# Patient Record
Sex: Female | Born: 1995 | Hispanic: No | Marital: Single | Smoking: Never smoker
Health system: Southern US, Community
[De-identification: ages and names within clinical notes are randomized; demographics above are authoritative.]

## PROBLEM LIST (undated history)

## (undated) ENCOUNTER — Inpatient Hospital Stay (HOSPITAL_COMMUNITY): Payer: Self-pay

## (undated) DIAGNOSIS — Z3400 Encounter for supervision of normal first pregnancy, unspecified trimester: Secondary | ICD-10-CM

## (undated) DIAGNOSIS — Z789 Other specified health status: Secondary | ICD-10-CM

## (undated) DIAGNOSIS — R87629 Unspecified abnormal cytological findings in specimens from vagina: Secondary | ICD-10-CM

## (undated) DIAGNOSIS — R8271 Bacteriuria: Secondary | ICD-10-CM

## (undated) HISTORY — DX: Unspecified abnormal cytological findings in specimens from vagina: R87.629

## (undated) HISTORY — PX: TONSILLECTOMY: SUR1361

---
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
## 2016-01-19 ENCOUNTER — Inpatient Hospital Stay (HOSPITAL_COMMUNITY): Payer: Medicaid Other

## 2016-01-19 ENCOUNTER — Inpatient Hospital Stay (HOSPITAL_COMMUNITY)
Admission: AD | Admit: 2016-01-19 | Discharge: 2016-01-19 | Disposition: A | Discharge status: Home / Self Care | Payer: Medicaid Other | Source: Ambulatory Visit | Attending: Family Medicine | Admitting: Family Medicine

## 2016-01-19 ENCOUNTER — Encounter (HOSPITAL_COMMUNITY): Payer: Self-pay | Admitting: *Deleted

## 2016-01-19 DIAGNOSIS — Z3A01 Less than 8 weeks gestation of pregnancy: Secondary | ICD-10-CM | POA: Diagnosis not present

## 2016-01-19 DIAGNOSIS — R1030 Lower abdominal pain, unspecified: Secondary | ICD-10-CM | POA: Insufficient documentation

## 2016-01-19 DIAGNOSIS — N760 Acute vaginitis: Secondary | ICD-10-CM

## 2016-01-19 DIAGNOSIS — R109 Unspecified abdominal pain: Secondary | ICD-10-CM

## 2016-01-19 DIAGNOSIS — O26899 Other specified pregnancy related conditions, unspecified trimester: Secondary | ICD-10-CM

## 2016-01-19 DIAGNOSIS — O26892 Other specified pregnancy related conditions, second trimester: Secondary | ICD-10-CM | POA: Diagnosis not present

## 2016-01-19 DIAGNOSIS — O2341 Unspecified infection of urinary tract in pregnancy, first trimester: Secondary | ICD-10-CM

## 2016-01-19 DIAGNOSIS — B9689 Other specified bacterial agents as the cause of diseases classified elsewhere: Secondary | ICD-10-CM

## 2016-01-19 HISTORY — DX: Other specified health status: Z78.9

## 2016-01-19 LAB — WET PREP, GENITAL
Sperm: NONE SEEN
TRICH WET PREP: NONE SEEN
Yeast Wet Prep HPF POC: NONE SEEN

## 2016-01-19 LAB — URINALYSIS, ROUTINE W REFLEX MICROSCOPIC
Bilirubin Urine: NEGATIVE
Glucose, UA: NEGATIVE mg/dL
Hgb urine dipstick: NEGATIVE
KETONES UR: NEGATIVE mg/dL
NITRITE: POSITIVE — AB
PH: 6 (ref 5.0–8.0)
Protein, ur: NEGATIVE mg/dL
Specific Gravity, Urine: 1.02 (ref 1.005–1.030)

## 2016-01-19 LAB — CBC
HEMATOCRIT: 34.5 % — AB (ref 36.0–46.0)
Hemoglobin: 12.2 g/dL (ref 12.0–15.0)
MCH: 31 pg (ref 26.0–34.0)
MCHC: 35.4 g/dL (ref 30.0–36.0)
MCV: 87.6 fL (ref 78.0–100.0)
PLATELETS: 365 10*3/uL (ref 150–400)
RBC: 3.94 MIL/uL (ref 3.87–5.11)
RDW: 12 % (ref 11.5–15.5)
WBC: 10.6 10*3/uL — AB (ref 4.0–10.5)

## 2016-01-19 LAB — URINE MICROSCOPIC-ADD ON: RBC / HPF: NONE SEEN RBC/hpf (ref 0–5)

## 2016-01-19 LAB — POCT PREGNANCY, URINE: Preg Test, Ur: POSITIVE — AB

## 2016-01-19 LAB — HCG, QUANTITATIVE, PREGNANCY: HCG, BETA CHAIN, QUANT, S: 70113 m[IU]/mL — AB (ref <5)

## 2016-01-19 MED ORDER — NITROFURANTOIN MONOHYD MACRO 100 MG PO CAPS: 100.0000 mg | ORAL_CAPSULE | Freq: Two times a day (BID) | ORAL | Status: DC

## 2016-01-19 MED ORDER — PROMETHAZINE HCL 25 MG PO TABS: 25.0000 mg | ORAL_TABLET | Freq: Four times a day (QID) | ORAL | Status: DC | PRN

## 2016-01-19 MED ORDER — METRONIDAZOLE 500 MG PO TABS: 500.0000 mg | ORAL_TABLET | Freq: Two times a day (BID) | ORAL | Status: DC

## 2016-01-19 MED ORDER — NITROFURANTOIN MONOHYD MACRO 100 MG PO CAPS
100.0000 mg | ORAL_CAPSULE | Freq: Once | ORAL | Status: AC
  Administered 2016-01-19: 100 mg via ORAL
  Filled 2016-01-19: qty 1

## 2016-01-19 MED ORDER — MECLIZINE HCL 25 MG PO TABS: 25.0000 mg | ORAL_TABLET | Freq: Three times a day (TID) | ORAL | Status: DC | PRN

## 2016-01-19 NOTE — MAU Note (Signed)
Pt reports she has ben having lower abd pain x 4 days. Urine is dark with an odor. Took HPT and it was positive. LMP 11/12/15

## 2016-01-19 NOTE — Discharge Instructions (Signed)

## 2016-01-19 NOTE — MAU Provider Note (Signed)
History     CSN: [id number]  Arrival date and time: 01/19/16 1203   First Provider Initiated Contact with Patient 01/19/16 1303      Chief Complaint  Patient presents with  . Abdominal Pain   HPIpt is 19 y.o.G1P0 at 8894w5d by LMP 11/12/2015 who presents with lower abdominal pain for 4 days. Pt +HPT Last week.  Pt was using condoms for contraception. Pt describes abd pain as mod pain cramping on and off. Pt has had some nausea, no vomiting. Pt has had diarrhea on and off since last week- pt normally gets diarrhea with periods. Pt denies dysuria, but has noticed dark urine odor. Pt denies chills or fever and back aches. RN note:      Expand All Collapse All   Pt reports she has ben having lower abd pain x 4 days. Urine is dark with an odor. Took HPT and it was positive. LMP 11/12/15         Past Medical History  Diagnosis Date  . Medical history non-contributory     Past Surgical History  Procedure Laterality Date  . No past surgeries      History reviewed. No pertinent family history.  Social History  Substance Use Topics  . Smoking status: Never Smoker   . Smokeless tobacco: None  . Alcohol Use: No    Allergies:  Allergies  Allergen Reactions  . Kiwi Extract Swelling  . Mushroom Extract Complex Swelling  . Pollen Extract Itching    No prescriptions prior to admission    Review of Systems  Constitutional: Negative for fever and chills.  Gastrointestinal: Positive for nausea, abdominal pain and diarrhea. Negative for vomiting and constipation.  Genitourinary: Positive for frequency. Negative for dysuria.  Musculoskeletal: Negative for back pain.  Neurological: Negative for dizziness and headaches.   Physical Exam   Blood pressure 118/64, pulse 92, temperature 98.3 F (36.8 C), temperature source Oral, resp. rate 18, height 5\' 1"  (1.549 m), weight 110 lb 1.9 oz (49.95 kg), last menstrual period 11/12/2015.  Physical Exam  Nursing note and vitals  reviewed. Constitutional: She is oriented to person, place, and time. She appears well-developed and well-nourished. No distress.  HENT:  Head: Normocephalic.  Eyes: Pupils are equal, round, and reactive to light.  Neck: Normal range of motion. Neck supple.  Cardiovascular: Normal rate.   Respiratory: Effort normal.  GI: Soft. She exhibits no distension. There is no tenderness. There is no rebound and no guarding.  Genitourinary:  Small inclusion cyst right labia majora- no redness or tenderness Small amount of white milky discharge in vault; cervix  Closed, nontender; uterus retroverted- unable to palpate Size- No tenderness with exam  Musculoskeletal: Normal range of motion.  Neurological: She is alert and oriented to person, place, and time.  Skin: Skin is warm and dry.  Psychiatric: She has a normal mood and affect.    MAU Course  Procedures Results for orders placed or performed during the hospital encounter of 01/19/16 (from the past 24 hour(s))  Urinalysis, Routine w reflex microscopic ([facility])     Status: Abnormal   Collection Time: 01/19/16 12:20 PM  Result Value Ref Range   Color, Urine YELLOW YELLOW   APPearance HAZY (A) CLEAR   Specific Gravity, Urine 1.020 1.005 - 1.030   pH 6.0 5.0 - 8.0   Glucose, UA NEGATIVE NEGATIVE mg/dL   Hgb urine dipstick NEGATIVE NEGATIVE   Bilirubin Urine NEGATIVE NEGATIVE   Ketones, ur NEGATIVE NEGATIVE mg/dL  Protein, ur NEGATIVE NEGATIVE mg/dL   Nitrite POSITIVE (A) NEGATIVE   Leukocytes, UA SMALL (A) NEGATIVE  Urine microscopic-add on     Status: Abnormal   Collection Time: 01/19/16 12:20 PM  Result Value Ref Range   Squamous Epithelial / LPF 0-5 (A) NONE SEEN   WBC, UA 6-30 0 - 5 WBC/hpf   RBC / HPF NONE SEEN 0 - 5 RBC/hpf   Bacteria, UA FEW (A) NONE SEEN   Urine-Other MUCOUS PRESENT   Pregnancy, urine POC     Status: Abnormal   Collection Time: 01/19/16 12:33 PM  Result Value Ref Range   Preg Test, Ur POSITIVE (A)  NEGATIVE  hCG, quantitative, pregnancy     Status: Abnormal   Collection Time: 01/19/16 12:59 PM  Result Value Ref Range   hCG, Beta Chain, Sharene Butters, S [phone] (H) <5 mIU/mL  CBC     Status: Abnormal   Collection Time: 01/19/16 12:59 PM  Result Value Ref Range   WBC 10.6 (H) 4.0 - 10.5 K/uL   RBC 3.94 3.87 - 5.11 MIL/uL   Hemoglobin 12.2 12.0 - 15.0 g/dL   HCT 60.4 (L) 54.0 - 98.1 %   MCV 87.6 78.0 - 100.0 fL   MCH 31.0 26.0 - 34.0 pg   MCHC 35.4 30.0 - 36.0 g/dL   RDW 19.1 47.8 - 29.5 %   Platelets 365 150 - 400 K/uL  ABO/Rh     Status: None (Preliminary result)   Collection Time: 01/19/16 12:59 PM  Result Value Ref Range   ABO/RH(D) O POS   Wet prep, genital     Status: Abnormal   Collection Time: 01/19/16  1:23 PM  Result Value Ref Range   Yeast Wet Prep HPF POC NONE SEEN NONE SEEN   Trich, Wet Prep NONE SEEN NONE SEEN   Clue Cells Wet Prep HPF POC PRESENT (A) NONE SEEN   WBC, Wet Prep HPF POC FEW (A) NONE SEEN   Sperm NONE SEEN   GC/Chlamydia pending Urine culture pending macrobid x1 in MAU US Ob Comp Less 14 Wks  01/19/2016  CLINICAL DATA:  Pregnant, for viability EXAM: OBSTETRIC <14 WK [place] AND TRANSVAGINAL OB US TECHNIQUE: Both transabdominal and transvaginal ultrasound examinations were performed for complete evaluation of the gestation as well as the maternal uterus, adnexal regions, and pelvic cul-de-sac. Transvaginal technique was performed to assess early pregnancy. COMPARISON:  None. FINDINGS: Intrauterine gestational sac: Single Yolk sac:  Present Embryo:  Present Cardiac Activity: Present Heart Rate: 154  bpm CRL:  10.3  mm   7 w   1 d                  [place] EDC: 09/05/2016 Subchorionic hemorrhage:  None visualized. Maternal uterus/adnexae: Bilateral ovaries are within normal limits, noting a left corpus luteal cyst. Trace pelvic fluid. IMPRESSION: Single live intrauterine gestation, with estimated gestational age [redacted] weeks 1 day by crown-rump length. Electronically Signed   By:  Charline Bills M.D.   On: 01/19/2016 14:35   US Ob Transvaginal  01/19/2016  CLINICAL DATA:  Pregnant, for viability EXAM: OBSTETRIC <14 WK [place] AND TRANSVAGINAL OB US TECHNIQUE: Both transabdominal and transvaginal ultrasound examinations were performed for complete evaluation of the gestation as well as the maternal uterus, adnexal regions, and pelvic cul-de-sac. Transvaginal technique was performed to assess early pregnancy. COMPARISON:  None. FINDINGS: Intrauterine gestational sac: Single Yolk sac:  Present Embryo:  Present Cardiac Activity: Present Heart Rate: 154  bpm CRL:  10.3  mm   7 w   1 d                  [place] EDC: 09/05/2016 Subchorionic hemorrhage:  None visualized. Maternal uterus/adnexae: Bilateral ovaries are within normal limits, noting a left corpus luteal cyst. Trace pelvic fluid. IMPRESSION: Single live intrauterine gestation, with estimated gestational age [redacted] weeks 1 day by crown-rump length. Electronically Signed   By: Charline Bills M.D.   On: 01/19/2016 14:35   Assessment and Plan  Viable IUP [redacted]w[redacted]d with [facility] 09/05/2016 abd pain in preg UTI MAcrobid BID for 7 days - 1st dose in MAU #13 BV- Flagyl 500mg  BID for 7 days F/u with OB care- [facility] or [facility] for worsening pain, any bleeding, fever or chills Rx for antivert and Phenergan prn nausea  Coryn Mosso 01/19/2016, 1:14 PM

## 2016-01-20 LAB — GC/CHLAMYDIA PROBE AMP (~~LOC~~) NOT AT ARMC
CHLAMYDIA, DNA PROBE: POSITIVE — AB
NEISSERIA GONORRHEA: NEGATIVE

## 2016-01-20 LAB — HIV ANTIBODY (ROUTINE TESTING W REFLEX): HIV Screen 4th Generation wRfx: NONREACTIVE

## 2016-01-20 LAB — ABO/RH: ABO/RH(D): O POS

## 2016-01-21 LAB — CULTURE, OB URINE: Special Requests: NORMAL

## 2016-01-21 NOTE — MAU Provider Note (Signed)
Phone pt and discussed findings of of pos chla. Pt Rx called to [facility] per me and pts partner to get tx.

## 2016-02-14 ENCOUNTER — Ambulatory Visit (INDEPENDENT_AMBULATORY_CARE_PROVIDER_SITE_OTHER): Payer: Medicaid Other | Admitting: Obstetrics and Gynecology

## 2016-02-14 VITALS — BP 105/64 | HR 84 | Wt 115.0 lb

## 2016-02-14 DIAGNOSIS — A749 Chlamydial infection, unspecified: Secondary | ICD-10-CM

## 2016-02-14 DIAGNOSIS — Z3402 Encounter for supervision of normal first pregnancy, second trimester: Secondary | ICD-10-CM

## 2016-02-14 DIAGNOSIS — Z3401 Encounter for supervision of normal first pregnancy, first trimester: Secondary | ICD-10-CM

## 2016-02-14 DIAGNOSIS — O98811 Other maternal infectious and parasitic diseases complicating pregnancy, first trimester: Secondary | ICD-10-CM

## 2016-02-14 DIAGNOSIS — Z3400 Encounter for supervision of normal first pregnancy, unspecified trimester: Secondary | ICD-10-CM | POA: Insufficient documentation

## 2016-02-14 DIAGNOSIS — O98819 Other maternal infectious and parasitic diseases complicating pregnancy, unspecified trimester: Secondary | ICD-10-CM

## 2016-02-14 HISTORY — DX: Encounter for supervision of normal first pregnancy, unspecified trimester: Z34.00

## 2016-02-14 MED ORDER — PRENATAL VITAMINS 0.8 MG PO TABS: 1.0000 | ORAL_TABLET | Freq: Every day | ORAL | 12 refills | Status: DC

## 2016-02-14 NOTE — Progress Notes (Signed)
  Subjective:    Tanya Rodgers is a G1P0 10232w6d being seen today for her first obstetrical visit.  Her obstetrical history is significant for teenage pregnancy, chlamydia infection in first trimester. Patient does intend to breast feed. Pregnancy history fully reviewed.  Patient reports no complaints.  Vitals:   02/14/16 1037  BP: 105/64  Pulse: 84  Weight: 115 lb (52.2 kg)    HISTORY: OB History  Gravida Para Term Preterm AB Living  1            SAB TAB Ectopic Multiple Live Births               # Outcome Date GA Lbr Len/2nd Weight Sex Delivery Anes PTL Lv  1 Current              Past Medical History:  Diagnosis Date  . Medical history non-contributory    Past Surgical History:  Procedure Laterality Date  . NO PAST SURGERIES     No family history on file.   Exam    Uterus:     Pelvic Exam:    Perineum: Normal Perineum   Vulva: normal   Vagina:  normal mucosa, normal discharge   pH:    Cervix: nulliparous appearance and cervix is closed and long   Adnexa: no mass, fullness, tenderness   Bony Pelvis: gynecoid  System: Breast:  normal appearance, no masses or tenderness   Skin: normal coloration and turgor, no rashes    Neurologic: oriented, no focal deficits   Extremities: normal strength, tone, and muscle mass   HEENT extra ocular movement intact   Mouth/Teeth mucous membranes moist, pharynx normal without lesions and dental hygiene good   Neck supple and no masses   Cardiovascular: regular rate and rhythm   Respiratory:  chest clear, no wheezing, crepitations, rhonchi, normal symmetric air entry   Abdomen: soft, non-tender; bowel sounds normal; no masses,  no organomegaly   Urinary:       Assessment:    Pregnancy: G1P0 Patient Active Problem List   Diagnosis Date Noted  . Supervision of normal first pregnancy, antepartum 02/14/2016  . Chlamydia infection affecting pregnancy, antepartum 02/14/2016        Plan:     Initial labs  drawn. Prenatal vitamins. Problem list reviewed and updated. Genetic Screening discussed First Screen: ordered.  Ultrasound discussed; fetal survey: requested. Will be ordered at a later visit  Follow up in 4 weeks. 50% of 30 min visit spent on counseling and coordination of care.     Jalaine Riggenbach 02/14/2016

## 2016-02-14 NOTE — Patient Instructions (Addendum)
First Trimester of Pregnancy The first trimester of pregnancy is from week 1 until the end of week 12 (months 1 through 3). A week after a sperm fertilizes an egg, the egg will implant on the wall of the uterus. This embryo will begin to develop into a baby. Genes from you and your partner are forming the baby. The female genes determine whether the baby is a boy or a girl. At 6-8 weeks, the eyes and face are formed, and the heartbeat can be seen on ultrasound. At the end of 12 weeks, all the baby's organs are formed.  Now that you are pregnant, you will want to do everything you can to have a healthy baby. Two of the most important things are to get good prenatal care and to follow your health care provider's instructions. Prenatal care is all the medical care you receive before the baby's birth. This care will help prevent, find, and treat any problems during the pregnancy and childbirth. BODY CHANGES Your body goes through many changes during pregnancy. The changes vary from woman to woman.   You may gain or lose a couple of pounds at first.  You may feel sick to your stomach (nauseous) and throw up (vomit). If the vomiting is uncontrollable, call your health care provider.  You may tire easily.  You may develop headaches that can be relieved by medicines approved by your health care provider.  You may urinate more often. Painful urination may mean you have a bladder infection.  You may develop heartburn as a result of your pregnancy.  You may develop constipation because certain hormones are causing the muscles that push waste through your intestines to slow down.  You may develop hemorrhoids or swollen, bulging veins (varicose veins).  Your breasts may begin to grow larger and become tender. Your nipples may stick out more, and the tissue that surrounds them (areola) may become darker.  Your gums may bleed and may be sensitive to brushing and flossing.  Dark spots or blotches  (chloasma, mask of pregnancy) may develop on your face. This will likely fade after the baby is born.  Your menstrual periods will stop.  You may have a loss of appetite.  You may develop cravings for certain kinds of food.  You may have changes in your emotions from day to day, such as being excited to be pregnant or being concerned that something may go wrong with the pregnancy and baby.  You may have more vivid and strange dreams.  You may have changes in your hair. These can include thickening of your hair, rapid growth, and changes in texture. Some women also have hair loss during or after pregnancy, or hair that feels dry or thin. Your hair will most likely return to normal after your baby is born. WHAT TO EXPECT AT YOUR PRENATAL VISITS During a routine prenatal visit:  You will be weighed to make sure you and the baby are growing normally.  Your blood pressure will be taken.  Your abdomen will be measured to track your baby's growth.  The fetal heartbeat will be listened to starting around week 10 or 12 of your pregnancy.  Test results from any previous visits will be discussed. Your health care provider may ask you:  How you are feeling.  If you are feeling the baby move.  If you have had any abnormal symptoms, such as leaking fluid, bleeding, severe headaches, or abdominal cramping.  If you are using any tobacco products,   including cigarettes, chewing tobacco, and electronic cigarettes.  If you have any questions. Other tests that may be performed during your first trimester include:  Blood tests to find your blood type and to check for the presence of any previous infections. They will also be used to check for low iron levels (anemia) and Rh antibodies. Later in the pregnancy, blood tests for diabetes will be done along with other tests if problems develop.  Urine tests to check for infections, diabetes, or protein in the urine.  An ultrasound to confirm the  proper growth and development of the baby.  An amniocentesis to check for possible genetic problems.  Fetal screens for spina bifida and Down syndrome.  You may need other tests to make sure you and the baby are doing well.  HIV (human immunodeficiency virus) testing. Routine prenatal testing includes screening for HIV, unless you choose not to have this test. HOME CARE INSTRUCTIONS  Medicines  Follow your health care provider's instructions regarding medicine use. Specific medicines may be either safe or unsafe to take during pregnancy.  Take your prenatal vitamins as directed.  If you develop constipation, try taking a stool softener if your health care provider approves. Diet  Eat regular, well-balanced meals. Choose a variety of foods, such as meat or vegetable-based protein, fish, milk and low-fat dairy products, vegetables, fruits, and whole grain breads and cereals. Your health care provider will help you determine the amount of weight gain that is right for you.  Avoid raw meat and uncooked cheese. These carry germs that can cause birth defects in the baby.  Eating four or five small meals rather than three large meals a day may help relieve nausea and vomiting. If you start to feel nauseous, eating a few soda crackers can be helpful. Drinking liquids between meals instead of during meals also seems to help nausea and vomiting.  If you develop constipation, eat more high-fiber foods, such as fresh vegetables or fruit and whole grains. Drink enough fluids to keep your urine clear or pale yellow. Activity and Exercise  Exercise only as directed by your health care provider. Exercising will help you:  Control your weight.  Stay in shape.  Be prepared for labor and delivery.  Experiencing pain or cramping in the lower abdomen or low back is a good sign that you should stop exercising. Check with your health care provider before continuing normal exercises.  Try to avoid  standing for long periods of time. Move your legs often if you must stand in one place for a long time.  Avoid heavy lifting.  Wear low-heeled shoes, and practice good posture.  You may continue to have sex unless your health care provider directs you otherwise. Relief of Pain or Discomfort  Wear a good support bra for breast tenderness.   Take warm sitz baths to soothe any pain or discomfort caused by hemorrhoids. Use hemorrhoid cream if your health care provider approves.   Rest with your legs elevated if you have leg cramps or low back pain.  If you develop varicose veins in your legs, wear support hose. Elevate your feet for 15 minutes, 3-4 times a day. Limit salt in your diet. Prenatal Care  Schedule your prenatal visits by the twelfth week of pregnancy. They are usually scheduled monthly at first, then more often in the last 2 months before delivery.  Write down your questions. Take them to your prenatal visits.  Keep all your prenatal visits as directed by your   health care provider. Safety  Wear your seat belt at all times when driving.  Make a list of emergency phone numbers, including numbers for family, friends, the hospital, and police and fire departments. General Tips  Ask your health care provider for a referral to a local prenatal education class. Begin classes no later than at the beginning of month 6 of your pregnancy.  Ask for help if you have counseling or nutritional needs during pregnancy. Your health care provider can offer advice or refer you to specialists for help with various needs.  Do not use hot tubs, steam rooms, or saunas.  Do not douche or use tampons or scented sanitary pads.  Do not cross your legs for long periods of time.  Avoid cat litter boxes and soil used by cats. These carry germs that can cause birth defects in the baby and possibly loss of the fetus by miscarriage or stillbirth.  Avoid all smoking, herbs, alcohol, and medicines  not prescribed by your health care provider. Chemicals in these affect the formation and growth of the baby.  Do not use any tobacco products, including cigarettes, chewing tobacco, and electronic cigarettes. If you need help quitting, ask your health care provider. You may receive counseling support and other resources to help you quit.  Schedule a dentist appointment. At home, brush your teeth with a soft toothbrush and be gentle when you floss. SEEK MEDICAL CARE IF:   You have dizziness.  You have mild pelvic cramps, pelvic pressure, or nagging pain in the abdominal area.  You have persistent nausea, vomiting, or diarrhea.  You have a bad smelling vaginal discharge.  You have pain with urination.  You notice increased swelling in your face, hands, legs, or ankles. SEEK IMMEDIATE MEDICAL CARE IF:   You have a fever.  You are leaking fluid from your vagina.  You have spotting or bleeding from your vagina.  You have severe abdominal cramping or pain.  You have rapid weight gain or loss.  You vomit blood or material that looks like coffee grounds.  You are exposed to German measles and have never had them.  You are exposed to fifth disease or chickenpox.  You develop a severe headache.  You have shortness of breath.  You have any kind of trauma, such as from a fall or a car accident.   This information is not intended to replace advice given to you by your health care provider. Make sure you discuss any questions you have with your health care provider.   Document Released: 06/12/2001 Document Revised: 07/09/2014 Document Reviewed: 04/28/2013 Elsevier Interactive Patient Education 2016 [employer]  Contraception Choices Contraception (birth control) is the use of any methods or devices to prevent pregnancy. Below are some methods to help avoid pregnancy. HORMONAL METHODS   Contraceptive implant. This is a thin, plastic tube containing progesterone hormone. It does  not contain estrogen hormone. Your health care provider inserts the tube in the inner part of the upper arm. The tube can remain in place for up to 3 years. After 3 years, the implant must be removed. The implant prevents the ovaries from releasing an egg (ovulation), thickens the cervical mucus to prevent sperm from entering the uterus, and thins the lining of the inside of the uterus.  Progesterone-only injections. These injections are given every 3 months by your health care provider to prevent pregnancy. This synthetic progesterone hormone stops the ovaries from releasing eggs. It also thickens cervical mucus and changes the   uterine lining. This makes it harder for sperm to survive in the uterus.  Birth control pills. These pills contain estrogen and progesterone hormone. They work by preventing the ovaries from releasing eggs (ovulation). They also cause the cervical mucus to thicken, preventing the sperm from entering the uterus. Birth control pills are prescribed by a health care provider.Birth control pills can also be used to treat heavy periods.  Minipill. This type of birth control pill contains only the progesterone hormone. They are taken every day of each month and must be prescribed by your health care provider.  Birth control patch. The patch contains hormones similar to those in birth control pills. It must be changed once a week and is prescribed by a health care provider.  Vaginal ring. The ring contains hormones similar to those in birth control pills. It is left in the vagina for 3 weeks, removed for 1 week, and then a new one is put back in place. The patient must be comfortable inserting and removing the ring from the vagina.A health care provider's prescription is necessary.  Emergency contraception. Emergency contraceptives prevent pregnancy after unprotected sexual intercourse. This pill can be taken right after sex or up to 5 days after unprotected sex. It is most effective  the sooner you take the pills after having sexual intercourse. Most emergency contraceptive pills are available without a prescription. Check with your pharmacist. Do not use emergency contraception as your only form of birth control. BARRIER METHODS   Female condom. This is a thin sheath (latex or rubber) that is worn over the penis during sexual intercourse. It can be used with spermicide to increase effectiveness.  Female condom. This is a soft, loose-fitting sheath that is put into the vagina before sexual intercourse.  Diaphragm. This is a soft, latex, dome-shaped barrier that must be fitted by a health care provider. It is inserted into the vagina, along with a spermicidal jelly. It is inserted before intercourse. The diaphragm should be left in the vagina for 6 to 8 hours after intercourse.  Cervical cap. This is a round, soft, latex or plastic cup that fits over the cervix and must be fitted by a health care provider. The cap can be left in place for up to 48 hours after intercourse.  Sponge. This is a soft, circular piece of polyurethane foam. The sponge has spermicide in it. It is inserted into the vagina after wetting it and before sexual intercourse.  Spermicides. These are chemicals that kill or block sperm from entering the cervix and uterus. They come in the form of creams, jellies, suppositories, foam, or tablets. They do not require a prescription. They are inserted into the vagina with an applicator before having sexual intercourse. The process must be repeated every time you have sexual intercourse. INTRAUTERINE CONTRACEPTION  Intrauterine device (IUD). This is a T-shaped device that is put in a woman's uterus during a menstrual period to prevent pregnancy. There are 2 types:  Copper IUD. This type of IUD is wrapped in copper wire and is placed inside the uterus. Copper makes the uterus and fallopian tubes produce a fluid that kills sperm. It can stay in place for 10  years.  Hormone IUD. This type of IUD contains the hormone progestin (synthetic progesterone). The hormone thickens the cervical mucus and prevents sperm from entering the uterus, and it also thins the uterine lining to prevent implantation of a fertilized egg. The hormone can weaken or kill the sperm that get into the   uterus. It can stay in place for 3-5 years, depending on which type of IUD is used. PERMANENT METHODS OF CONTRACEPTION  Female tubal ligation. This is when the woman's fallopian tubes are surgically sealed, tied, or blocked to prevent the egg from traveling to the uterus.  Hysteroscopic sterilization. This involves placing a small coil or insert into each fallopian tube. Your doctor uses a technique called hysteroscopy to do the procedure. The device causes scar tissue to form. This results in permanent blockage of the fallopian tubes, so the sperm cannot fertilize the egg. It takes about 3 months after the procedure for the tubes to become blocked. You must use another form of birth control for these 3 months.  Female sterilization. This is when the female has the tubes that carry sperm tied off (vasectomy).This blocks sperm from entering the vagina during sexual intercourse. After the procedure, the man can still ejaculate fluid (semen). NATURAL PLANNING METHODS  Natural family planning. This is not having sexual intercourse or using a barrier method (condom, diaphragm, cervical cap) on days the woman could become pregnant.  Calendar method. This is keeping track of the length of each menstrual cycle and identifying when you are fertile.  Ovulation method. This is avoiding sexual intercourse during ovulation.  Symptothermal method. This is avoiding sexual intercourse during ovulation, using a thermometer and ovulation symptoms.  Post-ovulation method. This is timing sexual intercourse after you have ovulated. Regardless of which type or method of contraception you choose, it is  important that you use condoms to protect against the transmission of sexually transmitted infections (STIs). Talk with your health care provider about which form of contraception is most appropriate for you.   This information is not intended to replace advice given to you by your health care provider. Make sure you discuss any questions you have with your health care provider.   Document Released: 06/18/2005 Document Revised: 06/23/2013 Document Reviewed: 12/11/2012 Elsevier Interactive Patient Education 2016 [employer]  Breastfeeding Deciding to breastfeed is one of the best choices you can make for you and your baby. A change in hormones during pregnancy causes your breast tissue to grow and increases the number and size of your milk ducts. These hormones also allow proteins, sugars, and fats from your blood supply to make breast milk in your milk-producing glands. Hormones prevent breast milk from being released before your baby is born as well as prompt milk flow after birth. Once breastfeeding has begun, thoughts of your baby, as well as his or her sucking or crying, can stimulate the release of milk from your milk-producing glands.  BENEFITS OF BREASTFEEDING For Your Baby  Your first milk (colostrum) helps your baby's digestive system function better.  There are antibodies in your milk that help your baby fight off infections.  Your baby has a lower incidence of asthma, allergies, and sudden infant death syndrome.  The nutrients in breast milk are better for your baby than infant formulas and are designed uniquely for your baby's needs.  Breast milk improves your baby's brain development.  Your baby is less likely to develop other conditions, such as childhood obesity, asthma, or type 2 diabetes mellitus. For You  Breastfeeding helps to create a very special bond between you and your baby.  Breastfeeding is convenient. Breast milk is always available at the correct temperature  and costs nothing.  Breastfeeding helps to burn calories and helps you lose the weight gained during pregnancy.  Breastfeeding makes your uterus contract to its   prepregnancy size faster and slows bleeding (lochia) after you give birth.   Breastfeeding helps to lower your risk of developing type 2 diabetes mellitus, osteoporosis, and breast or ovarian cancer later in life. SIGNS THAT YOUR BABY IS HUNGRY Early Signs of Hunger  Increased alertness or activity.  Stretching.  Movement of the head from side to side.  Movement of the head and opening of the mouth when the corner of the mouth or cheek is stroked (rooting).  Increased sucking sounds, smacking lips, cooing, sighing, or squeaking.  Hand-to-mouth movements.  Increased sucking of fingers or hands. Late Signs of Hunger  Fussing.  Intermittent crying. Extreme Signs of Hunger Signs of extreme hunger will require calming and consoling before your baby will be able to breastfeed successfully. Do not wait for the following signs of extreme hunger to occur before you initiate breastfeeding:  Restlessness.  A loud, strong cry.  Screaming. BREASTFEEDING BASICS Breastfeeding Initiation  Find a comfortable place to sit or lie down, with your neck and back well supported.  Place a pillow or rolled up blanket under your baby to bring him or her to the level of your breast (if you are seated). Nursing pillows are specially designed to help support your arms and your baby while you breastfeed.  Make sure that your baby's abdomen is facing your abdomen.  Gently massage your breast. With your fingertips, massage from your chest wall toward your nipple in a circular motion. This encourages milk flow. You may need to continue this action during the feeding if your milk flows slowly.  Support your breast with 4 fingers underneath and your thumb above your nipple. Make sure your fingers are well away from your nipple and your baby's  mouth.  Stroke your baby's lips gently with your finger or nipple.  When your baby's mouth is open wide enough, quickly bring your baby to your breast, placing your entire nipple and as much of the colored area around your nipple (areola) as possible into your baby's mouth.  More areola should be visible above your baby's upper lip than below the lower lip.  Your baby's tongue should be between his or her lower gum and your breast.  Ensure that your baby's mouth is correctly positioned around your nipple (latched). Your baby's lips should create a seal on your breast and be turned out (everted).  It is common for your baby to suck about 2-3 minutes in order to start the flow of breast milk. Latching Teaching your baby how to latch on to your breast properly is very important. An improper latch can cause nipple pain and decreased milk supply for you and poor weight gain in your baby. Also, if your baby is not latched onto your nipple properly, he or she may swallow some air during feeding. This can make your baby fussy. Burping your baby when you switch breasts during the feeding can help to get rid of the air. However, teaching your baby to latch on properly is still the best way to prevent fussiness from swallowing air while breastfeeding. Signs that your baby has successfully latched on to your nipple:  Silent tugging or silent sucking, without causing you pain.  Swallowing heard between every 3-4 sucks.  Muscle movement above and in front of his or her ears while sucking. Signs that your baby has not successfully latched on to nipple:  Sucking sounds or smacking sounds from your baby while breastfeeding.  Nipple pain. If you think your baby has   not latched on correctly, slip your finger into the corner of your baby's mouth to break the suction and place it between your baby's gums. Attempt breastfeeding initiation again. Signs of Successful Breastfeeding Signs from your baby:  A  gradual decrease in the number of sucks or complete cessation of sucking.  Falling asleep.  Relaxation of his or her body.  Retention of a small amount of milk in his or her mouth.  Letting go of your breast by himself or herself. Signs from you:  Breasts that have increased in firmness, weight, and size 1-3 hours after feeding.  Breasts that are softer immediately after breastfeeding.  Increased milk volume, as well as a change in milk consistency and color by the fifth day of breastfeeding.  Nipples that are not sore, cracked, or bleeding. Signs That Your Baby is Getting Enough Milk  Wetting at least 3 diapers in a 24-hour period. The urine should be clear and pale yellow by age 5 days.  At least 3 stools in a 24-hour period by age 5 days. The stool should be soft and yellow.  At least 3 stools in a 24-hour period by age 7 days. The stool should be seedy and yellow.  No loss of weight greater than 10% of birth weight during the first 3 days of age.  Average weight gain of 4-7 ounces (113-198 g) per week after age 4 days.  Consistent daily weight gain by age 5 days, without weight loss after the age of 2 weeks. After a feeding, your baby may spit up a small amount. This is common. BREASTFEEDING FREQUENCY AND DURATION Frequent feeding will help you make more milk and can prevent sore nipples and breast engorgement. Breastfeed when you feel the need to reduce the fullness of your breasts or when your baby shows signs of hunger. This is called "breastfeeding on demand." Avoid introducing a pacifier to your baby while you are working to establish breastfeeding (the first 4-6 weeks after your baby is born). After this time you may choose to use a pacifier. Research has shown that pacifier use during the first year of a baby's life decreases the risk of sudden infant death syndrome (SIDS). Allow your baby to feed on each breast as long as he or she wants. Breastfeed until your baby is  finished feeding. When your baby unlatches or falls asleep while feeding from the first breast, offer the second breast. Because newborns are often sleepy in the first few weeks of life, you may need to awaken your baby to get him or her to feed. Breastfeeding times will vary from baby to baby. However, the following rules can serve as a guide to help you ensure that your baby is properly fed:  Newborns (babies 4 weeks of age or younger) may breastfeed every 1-3 hours.  Newborns should not go longer than 3 hours during the day or 5 hours during the night without breastfeeding.  You should breastfeed your baby a minimum of 8 times in a 24-hour period until you begin to introduce solid foods to your baby at around 6 months of age. BREAST MILK PUMPING Pumping and storing breast milk allows you to ensure that your baby is exclusively fed your breast milk, even at times when you are unable to breastfeed. This is especially important if you are going back to work while you are still breastfeeding or when you are not able to be present during feedings. Your lactation consultant can give you guidelines on   how long it is safe to store breast milk. A breast pump is a machine that allows you to pump milk from your breast into a sterile bottle. The pumped breast milk can then be stored in a refrigerator or freezer. Some breast pumps are operated by hand, while others use electricity. Ask your lactation consultant which type will work best for you. Breast pumps can be purchased, but some hospitals and breastfeeding support groups lease breast pumps on a monthly basis. A lactation consultant can teach you how to hand express breast milk, if you prefer not to use a pump. CARING FOR YOUR BREASTS WHILE YOU BREASTFEED Nipples can become dry, cracked, and sore while breastfeeding. The following recommendations can help keep your breasts moisturized and healthy:  Avoid using soap on your nipples.  Wear a supportive bra.  Although not required, special nursing bras and tank tops are designed to allow access to your breasts for breastfeeding without taking off your entire bra or top. Avoid wearing underwire-style bras or extremely tight bras.  Air dry your nipples for 3-4minutes after each feeding.  Use only cotton bra pads to absorb leaked breast milk. Leaking of breast milk between feedings is normal.  Use lanolin on your nipples after breastfeeding. Lanolin helps to maintain your skin's normal moisture barrier. If you use pure lanolin, you do not need to wash it off before feeding your baby again. Pure lanolin is not toxic to your baby. You may also hand express a few drops of breast milk and gently massage that milk into your nipples and allow the milk to air dry. In the first few weeks after giving birth, some women experience extremely full breasts (engorgement). Engorgement can make your breasts feel heavy, warm, and tender to the touch. Engorgement peaks within 3-5 days after you give birth. The following recommendations can help ease engorgement:  Completely empty your breasts while breastfeeding or pumping. You may want to start by applying warm, moist heat (in the shower or with warm water-soaked hand towels) just before feeding or pumping. This increases circulation and helps the milk flow. If your baby does not completely empty your breasts while breastfeeding, pump any extra milk after he or she is finished.  Wear a snug bra (nursing or regular) or tank top for 1-2 days to signal your body to slightly decrease milk production.  Apply ice packs to your breasts, unless this is too uncomfortable for you.  Make sure that your baby is latched on and positioned properly while breastfeeding. If engorgement persists after 48 hours of following these recommendations, contact your health care provider or a lactation consultant. OVERALL HEALTH CARE RECOMMENDATIONS WHILE BREASTFEEDING  Eat healthy foods.  Alternate between meals and snacks, eating 3 of each per day. Because what you eat affects your breast milk, some of the foods may make your baby more irritable than usual. Avoid eating these foods if you are sure that they are negatively affecting your baby.  Drink milk, fruit juice, and water to satisfy your thirst (about 10 glasses a day).  Rest often, relax, and continue to take your prenatal vitamins to prevent fatigue, stress, and anemia.  Continue breast self-awareness checks.  Avoid chewing and smoking tobacco. Chemicals from cigarettes that pass into breast milk and exposure to secondhand smoke may harm your baby.  Avoid alcohol and drug use, including marijuana. Some medicines that may be harmful to your baby can pass through breast milk. It is important to ask your health care provider   before taking any medicine, including all over-the-counter and prescription medicine as well as vitamin and herbal supplements. It is possible to become pregnant while breastfeeding. If birth control is desired, ask your health care provider about options that will be safe for your baby. SEEK MEDICAL CARE IF:  You feel like you want to stop breastfeeding or have become frustrated with breastfeeding.  You have painful breasts or nipples.  Your nipples are cracked or bleeding.  Your breasts are red, tender, or warm.  You have a swollen area on either breast.  You have a fever or chills.  You have nausea or vomiting.  You have drainage other than breast milk from your nipples.  Your breasts do not become full before feedings by the fifth day after you give birth.  You feel sad and depressed.  Your baby is too sleepy to eat well.  Your baby is having trouble sleeping.   Your baby is wetting less than 3 diapers in a 24-hour period.  Your baby has less than 3 stools in a 24-hour period.  Your baby's skin or the white part of his or her eyes becomes yellow.   Your baby is not gaining  weight by 5 days of age. SEEK IMMEDIATE MEDICAL CARE IF:  Your baby is overly tired (lethargic) and does not want to wake up and feed.  Your baby develops an unexplained fever.   This information is not intended to replace advice given to you by your health care provider. Make sure you discuss any questions you have with your health care provider.   Document Released: 06/18/2005 Document Revised: 03/09/2015 Document Reviewed: 12/10/2012 Elsevier Interactive Patient Education 2016 Elsevier Inc.  

## 2016-02-16 LAB — GC/CHLAMYDIA PROBE AMP
CHLAMYDIA, DNA PROBE: NEGATIVE
NEISSERIA GONORRHOEAE BY PCR: NEGATIVE

## 2016-02-18 LAB — URINE CULTURE, OB REFLEX

## 2016-02-18 LAB — CULTURE, OB URINE

## 2016-02-22 ENCOUNTER — Encounter (HOSPITAL_COMMUNITY): Payer: Self-pay | Admitting: Obstetrics and Gynecology

## 2016-02-22 LAB — HEMOGLOBINOPATHY EVALUATION
HEMOGLOBIN A2 QUANTITATION: 2.7 % (ref 0.7–3.1)
HGB A: 97.3 % (ref 94.0–98.0)
HGB C: 0 %
HGB S: 0 %
Hemoglobin F Quantitation: 0 % (ref 0.0–2.0)

## 2016-02-22 LAB — CYSTIC FIBROSIS MUTATION 97
CF Image: 0
GENE DIS ANAL CARRIER INTERP BLD/T-IMP: NOT DETECTED

## 2016-02-22 LAB — TOXASSURE SELECT 13 (MW), URINE: PDF: 0

## 2016-02-22 LAB — PRENATAL PROFILE I(LABCORP)
Antibody Screen: NEGATIVE
BASOS: 0 %
Basophils Absolute: 0 10*3/uL (ref 0.0–0.2)
EOS (ABSOLUTE): 0.1 10*3/uL (ref 0.0–0.4)
Eos: 1 %
HEMATOCRIT: 35.8 % (ref 34.0–46.6)
HEMOGLOBIN: 12.1 g/dL (ref 11.1–15.9)
HEP B S AG: NEGATIVE
Immature Grans (Abs): 0.1 10*3/uL (ref 0.0–0.1)
Immature Granulocytes: 1 %
LYMPHS ABS: 2.2 10*3/uL (ref 0.7–3.1)
Lymphs: 20 %
MCH: 31.2 pg (ref 26.6–33.0)
MCHC: 33.8 g/dL (ref 31.5–35.7)
MCV: 92 fL (ref 79–97)
MONOS ABS: 0.9 10*3/uL (ref 0.1–0.9)
Monocytes: 8 %
NEUTROS ABS: 7.9 10*3/uL — AB (ref 1.4–7.0)
Neutrophils: 70 %
PLATELETS: 304 10*3/uL (ref 150–379)
RBC: 3.88 x10E6/uL (ref 3.77–5.28)
RDW: 13.3 % (ref 12.3–15.4)
RPR: NONREACTIVE
Rh Factor: POSITIVE
Rubella Antibodies, IGG: 1.96 index (ref >0.99)
WBC: 11 10*3/uL — AB (ref 3.4–10.8)

## 2016-02-22 LAB — HIV ANTIBODY (ROUTINE TESTING W REFLEX): HIV SCREEN 4TH GENERATION: NONREACTIVE

## 2016-02-22 LAB — VARICELLA ZOSTER ANTIBODY, IGG: Varicella zoster IgG: 2113 index (ref >165)

## 2016-02-23 ENCOUNTER — Encounter: Payer: Self-pay | Admitting: Obstetrics and Gynecology

## 2016-02-23 DIAGNOSIS — R8271 Bacteriuria: Secondary | ICD-10-CM | POA: Insufficient documentation

## 2016-02-23 HISTORY — DX: Bacteriuria: R82.71

## 2016-02-29 ENCOUNTER — Other Ambulatory Visit: Payer: Self-pay | Admitting: Obstetrics and Gynecology

## 2016-02-29 ENCOUNTER — Other Ambulatory Visit (HOSPITAL_COMMUNITY): Payer: Medicaid Other

## 2016-02-29 ENCOUNTER — Ambulatory Visit (HOSPITAL_COMMUNITY): Payer: Medicaid Other

## 2016-02-29 ENCOUNTER — Ambulatory Visit (HOSPITAL_COMMUNITY)
Admission: RE | Admit: 2016-02-29 | Discharge: 2016-02-29 | Disposition: A | Discharge status: Home / Self Care | Payer: Medicaid Other | Source: Ambulatory Visit | Attending: Obstetrics and Gynecology | Admitting: Obstetrics and Gynecology

## 2016-02-29 ENCOUNTER — Encounter (HOSPITAL_COMMUNITY): Payer: Self-pay

## 2016-02-29 DIAGNOSIS — Z3A13 13 weeks gestation of pregnancy: Secondary | ICD-10-CM

## 2016-02-29 DIAGNOSIS — Z3682 Encounter for antenatal screening for nuchal translucency: Secondary | ICD-10-CM

## 2016-02-29 DIAGNOSIS — Z36 Encounter for antenatal screening of mother: Secondary | ICD-10-CM | POA: Insufficient documentation

## 2016-02-29 DIAGNOSIS — Z3401 Encounter for supervision of normal first pregnancy, first trimester: Secondary | ICD-10-CM

## 2016-03-08 ENCOUNTER — Other Ambulatory Visit (HOSPITAL_COMMUNITY): Payer: Self-pay

## 2016-03-14 ENCOUNTER — Ambulatory Visit (INDEPENDENT_AMBULATORY_CARE_PROVIDER_SITE_OTHER): Payer: Medicaid Other | Admitting: Obstetrics and Gynecology

## 2016-03-14 VITALS — BP 105/66 | HR 81 | Temp 97.6°F | Wt 118.2 lb

## 2016-03-14 DIAGNOSIS — Z3402 Encounter for supervision of normal first pregnancy, second trimester: Secondary | ICD-10-CM

## 2016-03-14 DIAGNOSIS — O98812 Other maternal infectious and parasitic diseases complicating pregnancy, second trimester: Secondary | ICD-10-CM

## 2016-03-14 DIAGNOSIS — R8271 Bacteriuria: Secondary | ICD-10-CM

## 2016-03-14 DIAGNOSIS — A749 Chlamydial infection, unspecified: Secondary | ICD-10-CM

## 2016-03-14 DIAGNOSIS — O98312 Other infections with a predominantly sexual mode of transmission complicating pregnancy, second trimester: Secondary | ICD-10-CM

## 2016-03-14 NOTE — Addendum Note (Signed)
Addended by: Catalina AntiguaONSTANT, Colt Martelle on: 03/14/2016 04:32 PM   Modules accepted: Orders

## 2016-03-14 NOTE — Progress Notes (Signed)
   PRENATAL VISIT NOTE  Subjective:  Tanya Rodgers is a 20 y.o. G1P0 at 7688w0d being seen today for ongoing prenatal care.  She is currently monitored for the following issues for this low-risk pregnancy and has Supervision of normal first pregnancy, antepartum; Chlamydia infection affecting pregnancy, antepartum; and GBS bacteriuria on her problem list.  Patient reports no complaints.  Contractions: Not present. Vag. Bleeding: None.  Movement: Present. Denies leaking of fluid.   The following portions of the patient's history were reviewed and updated as appropriate: allergies, current medications, past family history, past medical history, past social history, past surgical history and problem list. Problem list updated.  Objective:   Vitals:   03/14/16 1549  BP: 105/66  Pulse: 81  Temp: 97.6 F (36.4 C)  Weight: 118 lb 3.2 oz (53.6 kg)    Fetal Status: Fetal Heart Rate (bpm): 145   Movement: Present     General:  Alert, oriented and cooperative. Patient is in no acute distress.  Skin: Skin is warm and dry. No rash noted.   Cardiovascular: Normal heart rate noted  Respiratory: Normal respiratory effort, no problems with respiration noted  Abdomen: Soft, gravid, appropriate for gestational age. Pain/Pressure: Present     Pelvic:  Cervical exam deferred        Extremities: Normal range of motion.  Edema: Trace  Mental Status: Normal mood and affect. Normal behavior. Normal judgment and thought content.   Urinalysis: Urine Protein: Trace Urine Glucose: Negative  Assessment and Plan:  Pregnancy: G1P0 at 4988w0d  1. Supervision of normal first pregnancy, antepartum, second trimester Patient is doing well without complaints AFP today Anatomy ultrasound ordered - AFP, Serum, Open Spina Bifida - US OB Comp + 14 Wk; Future  2. GBS bacteriuria Will need prophylaxis in labor  3. Chlamydia infection affecting pregnancy, antepartum, second trimester Treated with negative  TOC  General obstetric precautions including but not limited to vaginal bleeding, contractions, leaking of fluid and fetal movement were reviewed in detail with the patient. Please refer to After Visit Summary for other counseling recommendations.  Return in about 4 weeks (around 04/11/2016).  Catalina AntiguaPeggy Raju Coppolino, MD

## 2016-03-16 LAB — AFP, QUAD SCREEN
DIA Mom Value: 0.55
DIA VALUE (EIA): 111.39 pg/mL
DSR (By Age)    1 IN: 1172
DSR (SECOND TRIMESTER) 1 IN: 10000
GESTATIONAL AGE AFP: 15 wk
MSAFP MOM: 0.98
MSAFP: 31.7 ng/mL
MSHCG Mom: 0.5
MSHCG: 28342 m[IU]/mL
Maternal Age At EDD: 19.3 YEARS
Osb Risk: 10000
Test Results:: NEGATIVE
UE3 MOM: 0.85
UE3 VALUE: 0.52 ng/mL
Weight: 138 [lb_av]

## 2016-04-12 ENCOUNTER — Encounter (HOSPITAL_COMMUNITY): Payer: Self-pay

## 2016-04-12 ENCOUNTER — Inpatient Hospital Stay (HOSPITAL_COMMUNITY)
Admission: AD | Admit: 2016-04-12 | Discharge: 2016-04-12 | Disposition: A | Discharge status: Home / Self Care | Payer: Medicaid Other | Source: Ambulatory Visit | Attending: Obstetrics & Gynecology | Admitting: Obstetrics & Gynecology

## 2016-04-12 DIAGNOSIS — Z3A19 19 weeks gestation of pregnancy: Secondary | ICD-10-CM | POA: Diagnosis not present

## 2016-04-12 DIAGNOSIS — A568 Sexually transmitted chlamydial infection of other sites: Secondary | ICD-10-CM | POA: Diagnosis not present

## 2016-04-12 DIAGNOSIS — A749 Chlamydial infection, unspecified: Secondary | ICD-10-CM

## 2016-04-12 DIAGNOSIS — O26892 Other specified pregnancy related conditions, second trimester: Secondary | ICD-10-CM

## 2016-04-12 DIAGNOSIS — O98312 Other infections with a predominantly sexual mode of transmission complicating pregnancy, second trimester: Secondary | ICD-10-CM | POA: Insufficient documentation

## 2016-04-12 DIAGNOSIS — M545 Low back pain: Secondary | ICD-10-CM | POA: Diagnosis present

## 2016-04-12 DIAGNOSIS — Z3400 Encounter for supervision of normal first pregnancy, unspecified trimester: Secondary | ICD-10-CM

## 2016-04-12 DIAGNOSIS — R109 Unspecified abdominal pain: Secondary | ICD-10-CM

## 2016-04-12 DIAGNOSIS — N949 Unspecified condition associated with female genital organs and menstrual cycle: Secondary | ICD-10-CM

## 2016-04-12 DIAGNOSIS — R8271 Bacteriuria: Secondary | ICD-10-CM

## 2016-04-12 DIAGNOSIS — R102 Pelvic and perineal pain: Secondary | ICD-10-CM | POA: Diagnosis not present

## 2016-04-12 DIAGNOSIS — O99820 Streptococcus B carrier state complicating pregnancy: Secondary | ICD-10-CM | POA: Diagnosis not present

## 2016-04-12 DIAGNOSIS — O26899 Other specified pregnancy related conditions, unspecified trimester: Secondary | ICD-10-CM

## 2016-04-12 DIAGNOSIS — O98819 Other maternal infectious and parasitic diseases complicating pregnancy, unspecified trimester: Secondary | ICD-10-CM

## 2016-04-12 LAB — URINALYSIS, ROUTINE W REFLEX MICROSCOPIC
BILIRUBIN URINE: NEGATIVE
Glucose, UA: NEGATIVE mg/dL
HGB URINE DIPSTICK: NEGATIVE
Ketones, ur: NEGATIVE mg/dL
Nitrite: NEGATIVE
PH: 6 (ref 5.0–8.0)
Protein, ur: NEGATIVE mg/dL
SPECIFIC GRAVITY, URINE: 1.025 (ref 1.005–1.030)

## 2016-04-12 LAB — URINE MICROSCOPIC-ADD ON: RBC / HPF: NONE SEEN RBC/hpf (ref 0–5)

## 2016-04-12 NOTE — MAU Note (Signed)
Pt presents to MAU with lower abdominal pain that goes into her back. Began 2 days ago, pain is constant. Denies bleeding, leaking of fluid and abnormal discharge.

## 2016-04-12 NOTE — MAU Provider Note (Signed)
Chief Complaint: Abdominal Pain   None     SUBJECTIVE HPI: Tanya Rodgers is a 20 y.o. G1P0 at [redacted]w[redacted]d by LMP who presents to maternity admissions reporting abdominal pain in her low abdomen, on both sides and low back pain that is intermittent, starting yesterday. She has tried Tylenol which does not help. The pain worsened with movement but does not go away completely at rest.  She has tried drinking water and changing positions which is not helping.  She denies any associated symptoms. She denies vaginal bleeding, vaginal itching/burning, urinary symptoms, h/a, dizziness, n/v, or fever/chills.     HPI  Past Medical History:  Diagnosis Date  . Medical history non-contributory    Past Surgical History:  Procedure Laterality Date  . TONSILLECTOMY     Social History   Social History  . Marital status: Single    Spouse name: N/A  . Number of children: N/A  . Years of education: N/A   Occupational History  . Not on file.   Social History Main Topics  . Smoking status: Never Smoker  . Smokeless tobacco: Never Used  . Alcohol use No  . Drug use: No  . Sexual activity: Yes   Other Topics Concern  . Not on file   Social History Narrative  . No narrative on file   No current facility-administered medications on file prior to encounter.    Current Outpatient Prescriptions on File Prior to Encounter  Medication Sig Dispense Refill  . meclizine (ANTIVERT) 25 MG tablet Take 1 tablet (25 mg total) by mouth 3 (three) times daily as needed for nausea. (Patient not taking: Reported on 02/14/2016) 30 tablet 0  . metroNIDAZOLE (FLAGYL) 500 MG tablet Take 1 tablet (500 mg total) by mouth 2 (two) times daily. (Patient not taking: Reported on 02/29/2016) 14 tablet 0  . nitrofurantoin, macrocrystal-monohydrate, (MACROBID) 100 MG capsule Take 1 capsule (100 mg total) by mouth 2 (two) times daily. (Patient not taking: Reported on 02/29/2016) 9 capsule 0  . Prenatal Multivit-Min-Fe-FA  (PRENATAL VITAMINS) 0.8 MG tablet Take 1 tablet by mouth daily. 30 tablet 12  . promethazine (PHENERGAN) 25 MG tablet Take 1 tablet (25 mg total) by mouth every 6 (six) hours as needed for nausea or vomiting. 30 tablet 0   Allergies  Allergen Reactions  . Kiwi Extract Swelling  . Mushroom Extract Complex Swelling  . Pollen Extract Itching    ROS:  Review of Systems  Constitutional: Negative for chills, fatigue and fever.  Respiratory: Negative for shortness of breath.   Cardiovascular: Negative for chest pain.  Gastrointestinal: Positive for abdominal pain. Negative for nausea and vomiting.  Genitourinary: Negative for difficulty urinating, dysuria, flank pain, vaginal bleeding, vaginal discharge and vaginal pain.  Musculoskeletal: Positive for back pain.  Neurological: Negative for dizziness and headaches.  Psychiatric/Behavioral: Negative.      I have reviewed patient's Past Medical Hx, Surgical Hx, Family Hx, Social Hx, medications and allergies.   Physical Exam  Patient Vitals for the past 24 hrs:  BP Temp Temp src Pulse Resp  04/12/16 1313 121/61 98.1 F (36.7 C) Oral 101 16   Constitutional: Well-developed, well-nourished female in no acute distress.  Cardiovascular: normal rate Respiratory: normal effort GI: Abd soft, non-tender. Pos BS x 4 MS: Extremities nontender, no edema, normal ROM Neurologic: Alert and oriented x 4.  GU: Neg CVAT.  PELVIC EXAM: Deferred  Dilation: Closed Effacement (%): Thick Cervical Position: Posterior Exam by:: L. Leftwhich-kerby CNM  FHT 140  by doppler  LAB RESULTS Results for orders placed or performed during the hospital encounter of 04/12/16 (from the past 24 hour(s))  Urinalysis, Routine w reflex microscopic (not at [facility])     Status: Abnormal   Collection Time: 04/12/16  1:00 PM  Result Value Ref Range   Color, Urine YELLOW YELLOW   APPearance CLEAR CLEAR   Specific Gravity, Urine 1.025 1.005 - 1.030   pH 6.0 5.0 - 8.0    Glucose, UA NEGATIVE NEGATIVE mg/dL   Hgb urine dipstick NEGATIVE NEGATIVE   Bilirubin Urine NEGATIVE NEGATIVE   Ketones, ur NEGATIVE NEGATIVE mg/dL   Protein, ur NEGATIVE NEGATIVE mg/dL   Nitrite NEGATIVE NEGATIVE   Leukocytes, UA TRACE (A) NEGATIVE  Urine microscopic-add on     Status: Abnormal   Collection Time: 04/12/16  1:00 PM  Result Value Ref Range   Squamous Epithelial / LPF 6-30 (A) NONE SEEN   WBC, UA 0-5 0 - 5 WBC/hpf   RBC / HPF NONE SEEN 0 - 5 RBC/hpf   Bacteria, UA MANY (A) NONE SEEN   Urine-Other MUCOUS PRESENT     O/Positive/-- (08/15 1531)  IMAGING No results found.  MAU Management/MDM: Ordered labs and reviewed results.  Likely round ligament pain.  No evidence of preterm labor. Recommend rest/ice/heat/warm bath/Tylenol, Ok to use short course of ibuprofen for pain at this time.  Pt stable at time of discharge.  ASSESSMENT 1. Abdominal pain affecting pregnancy   2. GBS bacteriuria   3. Supervision of normal first pregnancy, antepartum   4. Chlamydia infection affecting pregnancy, antepartum   5. Round ligament pain     PLAN Discharge home    Medication List    STOP taking these medications   meclizine 25 MG tablet Commonly known as:  ANTIVERT   metroNIDAZOLE 500 MG tablet Commonly known as:  FLAGYL   nitrofurantoin (macrocrystal-monohydrate) 100 MG capsule Commonly known as:  MACROBID     TAKE these medications   Prenatal Vitamins 0.8 MG tablet Take 1 tablet by mouth daily.   promethazine 25 MG tablet Commonly known as:  PHENERGAN Take 1 tablet (25 mg total) by mouth every 6 (six) hours as needed for nausea or vomiting.      Follow-up Information    [facility] .   Why:  On 10/17 as scheduled, return to MAU as needed for emergencies Contact information: [address] [place] [phone]          Sharen CounterLisa Leftwich-Kirby Certified Nurse-Midwife 04/12/2016  1:46 PM

## 2016-04-13 LAB — CULTURE, OB URINE: CULTURE: NO GROWTH

## 2016-04-17 ENCOUNTER — Ambulatory Visit (INDEPENDENT_AMBULATORY_CARE_PROVIDER_SITE_OTHER): Payer: Medicaid Other

## 2016-04-17 ENCOUNTER — Ambulatory Visit (INDEPENDENT_AMBULATORY_CARE_PROVIDER_SITE_OTHER): Payer: Medicaid Other | Admitting: Obstetrics and Gynecology

## 2016-04-17 VITALS — BP 114/77 | HR 76 | Wt 124.0 lb

## 2016-04-17 DIAGNOSIS — Z3402 Encounter for supervision of normal first pregnancy, second trimester: Secondary | ICD-10-CM

## 2016-04-17 DIAGNOSIS — R8271 Bacteriuria: Secondary | ICD-10-CM

## 2016-04-17 DIAGNOSIS — Z3400 Encounter for supervision of normal first pregnancy, unspecified trimester: Secondary | ICD-10-CM

## 2016-04-17 NOTE — Progress Notes (Signed)
   PRENATAL VISIT NOTE  Subjective:  Tanya Rodgers is a 20 y.o. G1P0 at 2676w6d being seen today for ongoing prenatal care.  She is currently monitored for the following issues for this low-risk pregnancy and has Supervision of normal first pregnancy, antepartum; Chlamydia infection affecting pregnancy, antepartum; and GBS bacteriuria on her problem list.  Patient reports no complaints.  Contractions: Not present. Vag. Bleeding: None.  Movement: Present. Denies leaking of fluid.   The following portions of the patient's history were reviewed and updated as appropriate: allergies, current medications, past family history, past medical history, past social history, past surgical history and problem list. Problem list updated.  Objective:   Vitals:   04/17/16 1009  BP: 114/77  Pulse: 76  Weight: 124 lb (56.2 kg)    Fetal Status: Fetal Heart Rate (bpm): 158   Movement: Present     General:  Alert, oriented and cooperative. Patient is in no acute distress.  Skin: Skin is warm and dry. No rash noted.   Cardiovascular: Normal heart rate noted  Respiratory: Normal respiratory effort, no problems with respiration noted  Abdomen: Soft, gravid, appropriate for gestational age. Pain/Pressure: Present     Pelvic:  Cervical exam deferred        Extremities: Normal range of motion.  Edema: None  Mental Status: Normal mood and affect. Normal behavior. Normal judgment and thought content.   Assessment and Plan:  Pregnancy: G1P0 at 6276w6d  1. Supervision of normal first pregnancy, antepartum Patient is doing well without complaints Patient declined flu vaccine Follow up anatomy ultrasound report performed today  2. GBS bacteriuria Will provide prophylaxis in labor  General obstetric precautions including but not limited to vaginal bleeding, contractions, leaking of fluid and fetal movement were reviewed in detail with the patient. Please refer to After Visit Summary for other counseling  recommendations.  Return in about 4 weeks (around 05/15/2016).  Catalina AntiguaPeggy Nikki Rusnak, MD

## 2016-05-15 ENCOUNTER — Encounter: Payer: Self-pay | Admitting: Obstetrics and Gynecology

## 2016-05-15 ENCOUNTER — Ambulatory Visit (INDEPENDENT_AMBULATORY_CARE_PROVIDER_SITE_OTHER): Payer: Medicaid Other | Admitting: Obstetrics and Gynecology

## 2016-05-15 VITALS — BP 111/71 | HR 87 | Temp 97.3°F | Wt 129.0 lb

## 2016-05-15 DIAGNOSIS — Z3402 Encounter for supervision of normal first pregnancy, second trimester: Secondary | ICD-10-CM

## 2016-05-15 DIAGNOSIS — R8271 Bacteriuria: Secondary | ICD-10-CM

## 2016-05-15 DIAGNOSIS — Z3400 Encounter for supervision of normal first pregnancy, unspecified trimester: Secondary | ICD-10-CM

## 2016-05-15 NOTE — Progress Notes (Signed)
Subjective:  Tanya Rodgers is a 20 y.o. G1P0 at 811w6d being seen today for ongoing prenatal care.  She is currently monitored for the following issues for this low-risk pregnancy and has Supervision of normal first pregnancy, antepartum and GBS bacteriuria on her problem list.  Patient reports no complaints.  Contractions: Not present. Vag. Bleeding: None.  Movement: Present. Denies leaking of fluid.   The following portions of the patient's history were reviewed and updated as appropriate: allergies, current medications, past family history, past medical history, past social history, past surgical history and problem list. Problem list updated.  Objective:   Vitals:   05/15/16 1042  BP: 111/71  Pulse: 87  Temp: 97.3 F (36.3 C)  Weight: 129 lb (58.5 kg)    Fetal Status: Fetal Heart Rate (bpm): 143   Movement: Present     General:  Alert, oriented and cooperative. Patient is in no acute distress.  Skin: Skin is warm and dry. No rash noted.   Cardiovascular: Normal heart rate noted  Respiratory: Normal respiratory effort, no problems with respiration noted  Abdomen: Soft, gravid, appropriate for gestational age. Pain/Pressure: Absent     Pelvic:  Cervical exam deferred        Extremities: Normal range of motion.  Edema: Trace  Mental Status: Normal mood and affect. Normal behavior. Normal judgment and thought content.   Urinalysis:      Assessment and Plan:  Pregnancy: G1P0 at 971w6d  1. Supervision of normal first pregnancy, antepartum   2. GBS bacteriuria Treat in labor  Preterm labor symptoms and general obstetric precautions including but not limited to vaginal bleeding, contractions, leaking of fluid and fetal movement were reviewed in detail with the patient. Please refer to After Visit Summary for other counseling recommendations.  Return in about 4 weeks (around 06/12/2016).   Hermina StaggersMichael L Rahi Chandonnet, MD

## 2016-05-15 NOTE — Progress Notes (Signed)
Patient states that she feels good today, reports good fetal.

## 2016-06-14 ENCOUNTER — Ambulatory Visit (INDEPENDENT_AMBULATORY_CARE_PROVIDER_SITE_OTHER): Payer: Medicaid Other | Admitting: Obstetrics and Gynecology

## 2016-06-14 ENCOUNTER — Encounter: Payer: Self-pay | Admitting: Obstetrics and Gynecology

## 2016-06-14 ENCOUNTER — Other Ambulatory Visit: Payer: Medicaid Other

## 2016-06-14 DIAGNOSIS — Z23 Encounter for immunization: Secondary | ICD-10-CM

## 2016-06-14 DIAGNOSIS — Z3403 Encounter for supervision of normal first pregnancy, third trimester: Secondary | ICD-10-CM

## 2016-06-14 DIAGNOSIS — R8271 Bacteriuria: Secondary | ICD-10-CM

## 2016-06-14 DIAGNOSIS — Z3400 Encounter for supervision of normal first pregnancy, unspecified trimester: Secondary | ICD-10-CM

## 2016-06-14 MED ORDER — TETANUS-DIPHTH-ACELL PERTUSSIS 5-2.5-18.5 LF-MCG/0.5 IM SUSP
0.5000 mL | Freq: Once | INTRAMUSCULAR | Status: AC
  Administered 2016-06-14: 0.5 mL via INTRAMUSCULAR

## 2016-06-14 NOTE — Progress Notes (Signed)
Patient feels pain when the baby balls up

## 2016-06-14 NOTE — Progress Notes (Signed)
   PRENATAL VISIT NOTE  Subjective:  Tanya Rodgers is a 20 y.o. G1P0 at 81104w1d being seen today for ongoing prenatal care.  She is currently monitored for the following issues for this low-risk pregnancy and has Supervision of normal first pregnancy, antepartum and GBS bacteriuria on her problem list.  Patient reports no complaints.  Contractions: Not present. Vag. Bleeding: None.  Movement: Present. Denies leaking of fluid.   The following portions of the patient's history were reviewed and updated as appropriate: allergies, current medications, past family history, past medical history, past social history, past surgical history and problem list. Problem list updated.  Objective:   Vitals:   06/14/16 0910  BP: 113/73  Pulse: 96  Weight: 135 lb 6.4 oz (61.4 kg)    Fetal Status:     Movement: Present     General:  Alert, oriented and cooperative. Patient is in no acute distress.  Skin: Skin is warm and dry. No rash noted.   Cardiovascular: Normal heart rate noted  Respiratory: Normal respiratory effort, no problems with respiration noted  Abdomen: Soft, gravid, appropriate for gestational age. Pain/Pressure: Absent     Pelvic:  Cervical exam deferred        Extremities: Normal range of motion.  Edema: None  Mental Status: Normal mood and affect. Normal behavior. Normal judgment and thought content.   Assessment and Plan:  Pregnancy: G1P0 at 27104w1d  1. GBS bacteriuria Will receive prophylaxis in labor  2. Supervision of normal first pregnancy, antepartum Patient is doing well Anatomy ultrasound report still pending- [facility] contacted Third trimester labs, glucola and tdap today Patient plans to enroll in school in the spring following delivery  Preterm labor symptoms and general obstetric precautions including but not limited to vaginal bleeding, contractions, leaking of fluid and fetal movement were reviewed in detail with the patient. Please refer to After Visit Summary for  other counseling recommendations.  No Follow-up on file.   Catalina AntiguaPeggy Fateh Kindle, MD

## 2016-06-15 LAB — CBC
Hematocrit: 34.3 % (ref 34.0–46.6)
Hemoglobin: 11.4 g/dL (ref 11.1–15.9)
MCH: 31 pg (ref 26.6–33.0)
MCHC: 33.2 g/dL (ref 31.5–35.7)
MCV: 93 fL (ref 79–97)
PLATELETS: 308 10*3/uL (ref 150–379)
RBC: 3.68 x10E6/uL — AB (ref 3.77–5.28)
RDW: 13.2 % (ref 12.3–15.4)
WBC: 16.2 10*3/uL — AB (ref 3.4–10.8)

## 2016-06-15 LAB — RPR: RPR: NONREACTIVE

## 2016-06-15 LAB — GLUCOSE TOLERANCE, 2 HOURS W/ 1HR
GLUCOSE, 1 HOUR: 159 mg/dL (ref 65–179)
GLUCOSE, FASTING: 79 mg/dL (ref 65–91)
Glucose, 2 hour: 111 mg/dL (ref 65–152)

## 2016-06-15 LAB — HIV ANTIBODY (ROUTINE TESTING W REFLEX): HIV SCREEN 4TH GENERATION: NONREACTIVE

## 2016-07-05 ENCOUNTER — Encounter: Payer: Medicaid Other | Admitting: Family Medicine

## 2016-07-19 ENCOUNTER — Encounter: Payer: Medicaid Other | Admitting: Certified Nurse Midwife

## 2016-08-02 ENCOUNTER — Encounter: Payer: Medicaid Other | Admitting: Obstetrics and Gynecology

## 2016-11-23 ENCOUNTER — Encounter (HOSPITAL_COMMUNITY): Payer: Self-pay

## 2017-10-27 ENCOUNTER — Ambulatory Visit (HOSPITAL_COMMUNITY)
Admission: EM | Admit: 2017-10-27 | Discharge: 2017-10-27 | Disposition: A | Discharge status: Home / Self Care | Payer: Medicaid Other | Attending: Internal Medicine | Admitting: Internal Medicine

## 2017-10-27 ENCOUNTER — Encounter: Payer: Self-pay | Admitting: Emergency Medicine

## 2017-10-27 DIAGNOSIS — N644 Mastodynia: Secondary | ICD-10-CM | POA: Diagnosis not present

## 2017-10-27 DIAGNOSIS — F5089 Other specified eating disorder: Secondary | ICD-10-CM | POA: Diagnosis not present

## 2017-10-27 DIAGNOSIS — Z3202 Encounter for pregnancy test, result negative: Secondary | ICD-10-CM | POA: Diagnosis not present

## 2017-10-27 DIAGNOSIS — N943 Premenstrual tension syndrome: Secondary | ICD-10-CM | POA: Diagnosis not present

## 2017-10-27 DIAGNOSIS — R1030 Lower abdominal pain, unspecified: Secondary | ICD-10-CM | POA: Diagnosis not present

## 2017-10-27 DIAGNOSIS — R51 Headache: Secondary | ICD-10-CM | POA: Diagnosis not present

## 2017-10-27 DIAGNOSIS — R109 Unspecified abdominal pain: Secondary | ICD-10-CM

## 2017-10-27 DIAGNOSIS — R519 Headache, unspecified: Secondary | ICD-10-CM

## 2017-10-27 LAB — POCT URINALYSIS DIP (DEVICE)
BILIRUBIN URINE: NEGATIVE
GLUCOSE, UA: NEGATIVE mg/dL
KETONES UR: NEGATIVE mg/dL
LEUKOCYTES UA: NEGATIVE
Nitrite: NEGATIVE
Protein, ur: NEGATIVE mg/dL
SPECIFIC GRAVITY, URINE: 1.025 (ref 1.005–1.030)
Urobilinogen, UA: 1 mg/dL (ref 0.0–1.0)
pH: 6.5 (ref 5.0–8.0)

## 2017-10-27 LAB — POCT PREGNANCY, URINE: Preg Test, Ur: NEGATIVE

## 2017-10-27 MED ORDER — CELECOXIB 100 MG PO CAPS: 100.0000 mg | ORAL_CAPSULE | Freq: Two times a day (BID) | ORAL | 0 refills | Status: DC

## 2017-10-27 NOTE — Discharge Instructions (Addendum)
Hydrate well with at least 2 liters (1 gallon) of water daily.  °

## 2017-10-27 NOTE — ED Triage Notes (Signed)
Pt here for headache, abd cramping, spotting and breast tenderness.

## 2017-10-27 NOTE — ED Provider Notes (Signed)
  MRN: [id number] DOB: 16-Jul-1995  Subjective:   Tanya Rodgers is a 22 y.o. female presenting for 3-day history of general headaches, belly cramping, pelvic tenderness, breast tenderness, foot swelling, malaise.  Patient is currently on her menstrual cycle, reports that its regular.  Denies history of irregular or heavy bleeding.  Patient is sexually active but uses condoms for protection.  Denies fever, dysuria, urinary frequency, genital rash, vaginal discharge.  She has not tried any medications for relief.  She is not currently taking any chronic medications.  She has 1 daughter and is not breast-feeding.   Allergies  Allergen Reactions  . Kiwi Extract Swelling  . Mushroom Extract Complex Swelling  . Pollen Extract Itching    Past Medical History:  Diagnosis Date  . Medical history non-contributory      Past Surgical History:  Procedure Laterality Date  . TONSILLECTOMY      Objective:   Vitals: BP 118/68   Pulse 89   Temp 98.7 F (37.1 C) (Oral)   Resp 18   LMP 10/14/2017   SpO2 100%   Physical Exam  Constitutional: She is oriented to person, place, and time. She appears well-developed and well-nourished.  HENT:  Mouth/Throat: Oropharynx is clear and moist.  Eyes: Pupils are equal, round, and reactive to light. EOM are normal.  Cardiovascular: Normal rate, regular rhythm and intact distal pulses. Exam reveals no gallop and no friction rub.  No murmur heard. Pulmonary/Chest: No respiratory distress. She has no wheezes. She has no rales.  Abdominal: There is no hepatosplenomegaly. There is generalized tenderness. There is no rigidity, no rebound, no guarding, no CVA tenderness, no tenderness at McBurney's point and negative Murphy's sign.  Musculoskeletal:  Bilateral pedal edema.  Neurological: She is alert and oriented to person, place, and time.  Skin: Skin is warm and dry.  Psychiatric: She has a normal mood and affect.   Results for orders placed or  performed during the hospital encounter of 10/27/17 (from the past 24 hour(s))  POCT urinalysis dip (device)     Status: Abnormal   Collection Time: 10/27/17  6:44 PM  Result Value Ref Range   Glucose, UA NEGATIVE NEGATIVE mg/dL   Bilirubin Urine NEGATIVE NEGATIVE   Ketones, ur NEGATIVE NEGATIVE mg/dL   Specific Gravity, Urine 1.025 1.005 - 1.030   Hgb urine dipstick LARGE (A) NEGATIVE   pH 6.5 5.0 - 8.0   Protein, ur NEGATIVE NEGATIVE mg/dL   Urobilinogen, UA 1.0 0.0 - 1.0 mg/dL   Nitrite NEGATIVE NEGATIVE   Leukocytes, UA NEGATIVE NEGATIVE  Pregnancy, urine POC     Status: None   Collection Time: 10/27/17  6:51 PM  Result Value Ref Range   Preg Test, Ur NEGATIVE NEGATIVE   Assessment and Plan :   Lower abdominal pain  Generalized headache  Abdominal cramping  Pica  Breast tenderness in female  Counseled patient on diagnosis of premenstrual syndrome.  Discussed differential which includes ovarian cyst, uterine fibroid, STI.  Patient will establish care with a PCP or gynecologist, she was dropped into the work queue for this.  In the meantime she is going to try Celebrex for her symptoms.  Labs pending.   Wallis Bamberg, [place] 10/27/17 1931

## 2017-10-28 LAB — URINE CYTOLOGY ANCILLARY ONLY
Chlamydia: NEGATIVE
NEISSERIA GONORRHEA: NEGATIVE
Trichomonas: NEGATIVE

## 2017-10-29 ENCOUNTER — Emergency Department (HOSPITAL_COMMUNITY)
Admission: EM | Admit: 2017-10-29 | Discharge: 2017-10-29 | Disposition: A | Discharge status: Home / Self Care | Payer: Medicaid Other | Attending: Emergency Medicine | Admitting: Emergency Medicine

## 2017-10-29 ENCOUNTER — Encounter (HOSPITAL_COMMUNITY): Payer: Self-pay

## 2017-10-29 DIAGNOSIS — R102 Pelvic and perineal pain: Secondary | ICD-10-CM | POA: Insufficient documentation

## 2017-10-29 DIAGNOSIS — Z5321 Procedure and treatment not carried out due to patient leaving prior to being seen by health care provider: Secondary | ICD-10-CM | POA: Insufficient documentation

## 2017-10-29 NOTE — ED Notes (Signed)
Pt complains of heavy vaginal bleeding, it's time for her menstrual cycle but states this one is different Pt says her lower abdomen hurts

## 2017-10-30 ENCOUNTER — Emergency Department (HOSPITAL_COMMUNITY)
Admission: EM | Admit: 2017-10-30 | Discharge: 2017-10-30 | Disposition: A | Discharge status: Home / Self Care | Payer: Medicaid Other | Attending: Emergency Medicine | Admitting: Emergency Medicine

## 2017-10-30 ENCOUNTER — Encounter (HOSPITAL_COMMUNITY): Payer: Self-pay | Admitting: *Deleted

## 2017-10-30 DIAGNOSIS — N938 Other specified abnormal uterine and vaginal bleeding: Secondary | ICD-10-CM | POA: Diagnosis not present

## 2017-10-30 LAB — CBC
HCT: 36.4 % (ref 36.0–46.0)
Hemoglobin: 12.2 g/dL (ref 12.0–15.0)
MCH: 30.7 pg (ref 26.0–34.0)
MCHC: 33.5 g/dL (ref 30.0–36.0)
MCV: 91.5 fL (ref 78.0–100.0)
PLATELETS: 390 10*3/uL (ref 150–400)
RBC: 3.98 MIL/uL (ref 3.87–5.11)
RDW: 12.4 % (ref 11.5–15.5)
WBC: 9.7 10*3/uL (ref 4.0–10.5)

## 2017-10-30 LAB — I-STAT BETA HCG BLOOD, ED (MC, WL, AP ONLY): I-stat hCG, quantitative: <5 m[IU]/mL (ref <5)

## 2017-10-30 NOTE — ED Triage Notes (Signed)
Pt complains of vaginal bleeding, pt states her bleeding is heavier than her normal bleeding. Pt states she has cramping that is different from her usual menstrual cramping.

## 2017-10-30 NOTE — ED Provider Notes (Signed)
[facility]-EMERGENCY DEPT Provider Note   CSN: [id number] Arrival date & time: 10/30/17  0908     History   Chief Complaint Chief Complaint  Patient presents with  . Vaginal Bleeding    HPI Tanya Rodgers is a 22 y.o. female.  HPI Patient is a 22 year old female presents the emergency department with heavier than normal vaginal bleeding over the past 3 to 4 days.  She took 2 pregnancy test yesterday which were negative.  She reports some crampy lower abdominal pain.  No dysuria or urinary frequency.  No fevers or chills.  She is concerned about how heavy her menstrual bleeding is.  She used 3-4 pads yesterday.     Past Medical History:  Diagnosis Date  . Medical history non-contributory     Patient Active Problem List   Diagnosis Date Noted  . GBS bacteriuria 02/23/2016  . Supervision of normal first pregnancy, antepartum 02/14/2016    Past Surgical History:  Procedure Laterality Date  . TONSILLECTOMY       OB History    Gravida  1   Para      Term      Preterm      AB      Living        SAB      TAB      Ectopic      Multiple      Live Births               Home Medications    Prior to Admission medications   Medication Sig Start Date End Date Taking? Authorizing Provider  celecoxib (CELEBREX) 100 MG capsule Take 1 capsule (100 mg total) by mouth 2 (two) times daily. Patient not taking: Reported on 10/30/2017 10/27/17   Wallis Bamberg, PA-C    Family History No family history on file.  Social History Social History   Tobacco Use  . Smoking status: Never Smoker  . Smokeless tobacco: Never Used  Substance Use Topics  . Alcohol use: No  . Drug use: No     Allergies   Kiwi extract; Mushroom extract complex; and Pollen extract   Review of Systems Review of Systems  All other systems reviewed and are negative.    Physical Exam Updated Vital Signs BP 112/75 (BP Location: Right Arm)   Pulse 77   Temp  97.8 F (36.6 C) (Oral)   Resp 16   LMP 10/14/2017   SpO2 100%   Physical Exam  Constitutional: She is oriented to person, place, and time. She appears well-developed and well-nourished. No distress.  HENT:  Head: Normocephalic and atraumatic.  Eyes: EOM are normal.  Neck: Normal range of motion.  Cardiovascular: Normal rate and regular rhythm.  Pulmonary/Chest: Effort normal and breath sounds normal.  Abdominal: Soft. She exhibits no distension. There is no tenderness.  Genitourinary:  Genitourinary Comments: Chaperone present.  Normal external genitalia.  Scant amount of blood coming from the cervical loss.  No cervical motion tenderness.  No vaginal lacerations noted  Musculoskeletal: Normal range of motion.  Neurological: She is alert and oriented to person, place, and time.  Skin: Skin is warm and dry.  Psychiatric: She has a normal mood and affect. Judgment normal.  Nursing note and vitals reviewed.    ED Treatments / Results  Labs (all labs ordered are listed, but only abnormal results are displayed) Labs Reviewed  CBC  I-STAT BETA HCG BLOOD, ED ([facility], [facility], [facility] ONLY)  EKG None  Radiology No results found.  Procedures Procedures (including critical care time)  Medications Ordered in ED Medications - No data to display   Initial Impression / Assessment and Plan / ED Course  I have reviewed the triage vital signs and the nursing notes.  Pertinent labs & imaging results that were available during my care of the patient were reviewed by me and considered in my medical decision making (see chart for details).     Hemoglobin and platelets normal.  Scant vaginal bleeding on pelvic examination.  This is likely dysfunctional uterine bleeding.  She does not have a gynecologist.  She will need follow-up with a gynecologist.  Discharged home in good condition.  Pregnancy test is negative  Final Clinical Impressions(s) / ED Diagnoses   Final diagnoses:  Dysfunctional  uterine bleeding    ED Discharge Orders    None       Azalia Bilis, MD 10/30/17 1034

## 2018-07-02 NOTE — L&D Delivery Note (Addendum)
OB/GYN Faculty Practice Delivery Note  Tanya Rodgers is a 23 y.o. G2P1001 s/p NSVD at [redacted]w[redacted]d. She was admitted for IOL due to IUGR..   ROM: 2h 39m with clear fluid GBS Status:  --Henderson Cloud (12/01 0358) Maximum Maternal Temperature: 98.3   Labor Progress: . Patient arrived at 1.5 cm dilation and was induced with FB placed around 9AM. S/p cytotec x2 doses w/ last given 1341. Started Pit and AROM at [employer] and she progressed as expected.   Delivery Date/Time: 06/07/2019 at 2022 Delivery: Called to room and patient was complete and pushing. Head delivered in LOA position. Nuchal cord x1 present. Shoulder and body delivered through the cord via somersault maneuver. Infant with spontaneous cry, placed on mother's abdomen, dried and stimulated. Cord clamped x 2 after 1-minute delay, and cut by paternal grandmother of the baby under my direct supervision. Cord blood drawn. Placenta delivered spontaneously with gentle cord traction. Fundus firm with massage and Pitocin. Labia, perineum, vagina, and cervix inspected with no laceration.   Placenta: spontaneous, intact, 3 vessels, to pathology Complications: none Lacerations: none EBL: 53cc Analgesia: epidural   Infant: APGARs pending NICU eval.   Weight: pending  Demetrius Revel, MD PGY-3  GME ATTESTATION:  I saw and evaluated the patient. I was gowned and gloved throughout the entire delivery. I agree with the findings and the plan of care as documented in the resident's note.  Merilyn Baba, DO OB Fellow, Faculty Practice 06/07/2019 8:40 PM

## 2018-07-16 ENCOUNTER — Ambulatory Visit (INDEPENDENT_AMBULATORY_CARE_PROVIDER_SITE_OTHER): Payer: Medicaid Other

## 2018-07-16 DIAGNOSIS — Z3202 Encounter for pregnancy test, result negative: Secondary | ICD-10-CM

## 2018-07-16 LAB — POCT PREGNANCY, URINE: PREG TEST UR: NEGATIVE

## 2018-07-16 NOTE — Progress Notes (Addendum)
Pt here today for pregnancy test.  Resulted negative.  Pt reported that she had period about two weeks ago.  Pt stated that she thought she was pregnant because she was vomiting up food.  I advised pt that she should try to take another at home pregnancy test in about week or two.  Pt stated understanding with no further questions.   I reviewed the note and agree with the plan of care.  Nolene Bernheim, RN, MSN, NP-BC Nurse Practitioner, [facility] for [employer], [facility] 07/17/2018 10:48 PM

## 2018-10-31 ENCOUNTER — Encounter (HOSPITAL_COMMUNITY): Payer: Self-pay

## 2018-10-31 ENCOUNTER — Inpatient Hospital Stay (HOSPITAL_COMMUNITY)
Admission: AD | Admit: 2018-10-31 | Discharge: 2018-10-31 | Disposition: A | Discharge status: Home / Self Care | Payer: Medicaid Other | Attending: Obstetrics and Gynecology | Admitting: Obstetrics and Gynecology

## 2018-10-31 ENCOUNTER — Inpatient Hospital Stay (HOSPITAL_COMMUNITY): Payer: Medicaid Other

## 2018-10-31 ENCOUNTER — Other Ambulatory Visit: Payer: Self-pay

## 2018-10-31 DIAGNOSIS — O468X1 Other antepartum hemorrhage, first trimester: Secondary | ICD-10-CM | POA: Diagnosis not present

## 2018-10-31 DIAGNOSIS — R109 Unspecified abdominal pain: Secondary | ICD-10-CM | POA: Insufficient documentation

## 2018-10-31 DIAGNOSIS — Z3A01 Less than 8 weeks gestation of pregnancy: Secondary | ICD-10-CM | POA: Insufficient documentation

## 2018-10-31 DIAGNOSIS — O418X10 Other specified disorders of amniotic fluid and membranes, first trimester, not applicable or unspecified: Secondary | ICD-10-CM | POA: Diagnosis not present

## 2018-10-31 DIAGNOSIS — O26891 Other specified pregnancy related conditions, first trimester: Secondary | ICD-10-CM | POA: Insufficient documentation

## 2018-10-31 DIAGNOSIS — Z3A10 10 weeks gestation of pregnancy: Secondary | ICD-10-CM

## 2018-10-31 DIAGNOSIS — Z3491 Encounter for supervision of normal pregnancy, unspecified, first trimester: Secondary | ICD-10-CM

## 2018-10-31 LAB — URINALYSIS, ROUTINE W REFLEX MICROSCOPIC
Bilirubin Urine: NEGATIVE
Glucose, UA: NEGATIVE mg/dL
Hgb urine dipstick: NEGATIVE
Ketones, ur: NEGATIVE mg/dL
Nitrite: NEGATIVE
Protein, ur: 30 mg/dL — AB
Specific Gravity, Urine: 1.027 (ref 1.005–1.030)
pH: 5 (ref 5.0–8.0)

## 2018-10-31 LAB — WET PREP, GENITAL
Sperm: NONE SEEN
Trich, Wet Prep: NONE SEEN
Yeast Wet Prep HPF POC: NONE SEEN

## 2018-10-31 LAB — CBC
HCT: 34.2 % — ABNORMAL LOW (ref 36.0–46.0)
Hemoglobin: 11.8 g/dL — ABNORMAL LOW (ref 12.0–15.0)
MCH: 30.9 pg (ref 26.0–34.0)
MCHC: 34.5 g/dL (ref 30.0–36.0)
MCV: 89.5 fL (ref 80.0–100.0)
Platelets: 343 10*3/uL (ref 150–400)
RBC: 3.82 MIL/uL — ABNORMAL LOW (ref 3.87–5.11)
RDW: 12.4 % (ref 11.5–15.5)
WBC: 11.4 10*3/uL — ABNORMAL HIGH (ref 4.0–10.5)
nRBC: 0 % (ref 0.0–0.2)

## 2018-10-31 LAB — POCT PREGNANCY, URINE: Preg Test, Ur: POSITIVE — AB

## 2018-10-31 LAB — ABO/RH: ABO/RH(D): O POS

## 2018-10-31 LAB — HCG, QUANTITATIVE, PREGNANCY: hCG, Beta Chain, Quant, S: 16394 m[IU]/mL — ABNORMAL HIGH (ref <5)

## 2018-10-31 NOTE — MAU Note (Signed)
Pt presents to MAU with c/o lower abdominal cramping since March. Pt had +HPT in March, has not been seen. Pt denies VB and LOF.

## 2018-10-31 NOTE — Discharge Instructions (Signed)
[place] for [place] at [facility]       Phone: [phone]  Center for [place] at [place] Phone: [place] for [place] at [place]  Phone: [place] for [place] at [facility]  Phone: [place] for [place] at [facility]  Phone: [facility]       Phone: [phone]  [facility]    Phone: [phone]   [facility])    Phone: [facility]    Phone: [phone]  [facility]    Phone: [place] [facility]-M[facility]  Phone: [place]    Phone: [phone]  [facility]   Phone: [phone]  [facility]    Phone: [phone]     Subchorionic Hematoma  A subchorionic hematoma is a gathering of blood between the outer wall of the embryo (chorion) and the inner wall of the womb (uterus). This condition can cause vaginal bleeding. If they cause little or no vaginal bleeding, early small hematomas usually shrink on their own and do not affect your baby or pregnancy. When bleeding starts later in pregnancy, or if the hematoma is larger or occurs in older pregnant women, the condition may be more serious. Larger hematomas may get bigger, which increases the chances of miscarriage. This condition also increases the risk of:  Premature separation of the placenta from the uterus.  Premature (preterm) labor.  Stillbirth. What are the causes? The exact cause of this condition is not known. It occurs when blood is trapped between the placenta and the uterine wall because the placenta has separated from the original site of implantation. What increases the risk? You are more likely to develop this condition  if:  You were treated with fertility medicines.  You conceived through in vitro fertilization (IVF). What are the signs or symptoms? Symptoms of this condition include:  Vaginal spotting or bleeding.  Contractions of the uterus. These cause abdominal pain. Sometimes you may have no symptoms and the bleeding may only be seen when ultrasound images are taken (transvaginal ultrasound). How is this diagnosed? This condition is diagnosed based on a physical exam. This includes a pelvic exam. You may also have other tests, including:  Blood tests.  Urine tests.  Ultrasound of the abdomen. How is this treated? Treatment for this condition can vary. Treatment may include:  Watchful waiting. You will be monitored closely for any changes in bleeding. During this stage: ? The hematoma may be reabsorbed by the body. ? The hematoma may separate the fluid-filled space containing the embryo (gestational sac) from the wall of the womb (endometrium).  Medicines.  Activity restriction. This may be needed until the bleeding stops. Follow these instructions at home:  Stay on bed rest if told to do so by your health care provider.  Do not lift anything that is heavier than 10 lbs. (4.5 kg) or as told by your health care provider.  Do not use any products that contain nicotine or tobacco, such as cigarettes and e-cigarettes. If you need help quitting, ask your health care provider.  Track and write down the number of pads you use each day and how soaked (saturated) they are.  Do not use tampons.  Keep all follow-up visits as told by your health care provider. This is important. Your health  care provider may ask you to have follow-up blood tests or ultrasound tests or both. Contact a health care provider if:  You have any vaginal bleeding.  You have a fever. Get help right away if:  You have severe cramps in your stomach, back, abdomen, or pelvis.  You pass large clots or tissue. Save  any tissue for your health care provider to look at.  You have more vaginal bleeding, and you faint or become lightheaded or weak. Summary  A subchorionic hematoma is a gathering of blood between the outer wall of the placenta and the uterus.  This condition can cause vaginal bleeding.  Sometimes you may have no symptoms and the bleeding may only be seen when ultrasound images are taken.  Treatment may include watchful waiting, medicines, or activity restriction. This information is not intended to replace advice given to you by your health care provider. Make sure you discuss any questions you have with your health care provider. Document Released: 10/03/2006 Document Revised: 08/14/2016 Document Reviewed: 08/14/2016 Elsevier Interactive Patient Education  2019 [employer].

## 2018-10-31 NOTE — MAU Provider Note (Signed)
Chief Complaint: Possible Pregnancy and Abdominal Pain   First Provider Initiated Contact with Patient 10/31/18 1743     SUBJECTIVE HPI: Tanya Rodgers is a 23 y.o. G2P1001 at [redacted]w[redacted]d who presents to Maternity Admissions reporting abdominal pain. Symptoms began last month. Reports intermittent lower abdominal pain that occurs daily. Nothing makes better or worse. Denies n/v/d, constipation, dysuria, vaginal bleeding, or vaginal discharge. Hasn't started prenatal care yet.   Location: lower abdomen Quality: cramping, sharp Severity: 8/10 on pain scale Duration: 1 month Timing: intermittent, daily at night time Modifying factors: none Associated signs and symptoms: none  Past Medical History:  Diagnosis Date  . Medical history non-contributory    OB History  Gravida Para Term Preterm AB Living  2 1 1     1   SAB TAB Ectopic Multiple Live Births          1    # Outcome Date GA Lbr Len/2nd Weight Sex Delivery Anes PTL Lv  2 Current           1 Term 2018 [redacted]w[redacted]d       LIV   Past Surgical History:  Procedure Laterality Date  . TONSILLECTOMY     Social History   Socioeconomic History  . Marital status: Single    Spouse name: Not on file  . Number of children: Not on file  . Years of education: Not on file  . Highest education level: Not on file  Occupational History  . Not on file  Social Needs  . Financial resource strain: Not on file  . Food insecurity:    Worry: Not on file    Inability: Not on file  . Transportation needs:    Medical: Not on file    Non-medical: Not on file  Tobacco Use  . Smoking status: Never Smoker  . Smokeless tobacco: Never Used  Substance and Sexual Activity  . Alcohol use: No  . Drug use: No  . Sexual activity: Yes  Lifestyle  . Physical activity:    Days per week: Not on file    Minutes per session: Not on file  . Stress: Not on file  Relationships  . Social connections:    Talks on phone: Not on file    Gets together: Not on file     Attends religious service: Not on file    Active member of club or organization: Not on file    Attends meetings of clubs or organizations: Not on file    Relationship status: Not on file  . Intimate partner violence:    Fear of current or ex partner: Not on file    Emotionally abused: Not on file    Physically abused: Not on file    Forced sexual activity: Not on file  Other Topics Concern  . Not on file  Social History Narrative  . Not on file   History reviewed. No pertinent family history. No current facility-administered medications on file prior to encounter.    No current outpatient medications on file prior to encounter.   Allergies  Allergen Reactions  . Kiwi Extract Swelling  . Mushroom Extract Complex Swelling  . Pollen Extract Itching    I have reviewed patient's Past Medical Hx, Surgical Hx, Family Hx, Social Hx, medications and allergies.   Review of Systems  Constitutional: Negative.   Gastrointestinal: Positive for abdominal pain.  Genitourinary: Negative.     OBJECTIVE Patient Vitals for the past 24 hrs:  BP Temp Temp src  Pulse Resp Height Weight  10/31/18 1906 124/65 - - - - - -  10/31/18 1655 123/69 98.5 F (36.9 C) Oral 85 18 5\' 1"  (1.549 m) 61.7 kg   Constitutional: Well-developed, well-nourished female in no acute distress.  Cardiovascular: normal rate & rhythm, no murmur Respiratory: normal rate and effort. Lung sounds clear throughout GI: Abd soft, non-tender, Pos BS x 4. No guarding or rebound tenderness MS: Extremities nontender, no edema, normal ROM Neurologic: Alert and oriented x 4.      LAB RESULTS Results for orders placed or performed during the hospital encounter of 10/31/18 (from the past 24 hour(s))  Urinalysis, Routine w reflex microscopic     Status: Abnormal   Collection Time: 10/31/18  4:59 PM  Result Value Ref Range   Color, Urine YELLOW YELLOW   APPearance CLOUDY (A) CLEAR   Specific Gravity, Urine 1.027 1.005 -  1.030   pH 5.0 5.0 - 8.0   Glucose, UA NEGATIVE NEGATIVE mg/dL   Hgb urine dipstick NEGATIVE NEGATIVE   Bilirubin Urine NEGATIVE NEGATIVE   Ketones, ur NEGATIVE NEGATIVE mg/dL   Protein, ur 30 (A) NEGATIVE mg/dL   Nitrite NEGATIVE NEGATIVE   Leukocytes,Ua MODERATE (A) NEGATIVE   RBC / HPF 0-5 0 - 5 RBC/hpf   WBC, UA 11-20 0 - 5 WBC/hpf   Bacteria, UA FEW (A) NONE SEEN   Squamous Epithelial / LPF 21-50 0 - 5   Mucus PRESENT   Pregnancy, urine POC     Status: Abnormal   Collection Time: 10/31/18  5:09 PM  Result Value Ref Range   Preg Test, Ur POSITIVE (A) NEGATIVE  Wet prep, genital     Status: Abnormal   Collection Time: 10/31/18  5:52 PM  Result Value Ref Range   Yeast Wet Prep HPF POC NONE SEEN NONE SEEN   Trich, Wet Prep NONE SEEN NONE SEEN   Clue Cells Wet Prep HPF POC PRESENT (A) NONE SEEN   WBC, Wet Prep HPF POC MANY (A) NONE SEEN   Sperm NONE SEEN   CBC     Status: Abnormal   Collection Time: 10/31/18  6:09 PM  Result Value Ref Range   WBC 11.4 (H) 4.0 - 10.5 K/uL   RBC 3.82 (L) 3.87 - 5.11 MIL/uL   Hemoglobin 11.8 (L) 12.0 - 15.0 g/dL   HCT 40.934.2 (L) 81.136.0 - 91.446.0 %   MCV 89.5 80.0 - 100.0 fL   MCH 30.9 26.0 - 34.0 pg   MCHC 34.5 30.0 - 36.0 g/dL   RDW 78.212.4 95.611.5 - 21.315.5 %   Platelets 343 150 - 400 K/uL   nRBC 0.0 0.0 - 0.2 %  ABO/Rh     Status: None   Collection Time: 10/31/18  6:09 PM  Result Value Ref Range   ABO/RH(D) O POS    No rh immune globuloin      NOT A RH IMMUNE GLOBULIN CANDIDATE, PT RH POSITIVE Performed at [facility], [address] [place], [place] [address]   hCG, quantitative, pregnancy     Status: Abnormal   Collection Time: 10/31/18  6:09 PM  Result Value Ref Range   hCG, Beta Chain, Quant, S 16,394 (H) <5 mIU/mL    IMAGING Kore[place] Ob Less Than 14 Weeks With Ob Transvaginal  Result Date: 10/31/2018 CLINICAL DATA:  Abdominal/pelvic pain during first trimester of pregnancy EXAM: OBSTETRIC <14 WK US AND TRANSVAGINAL OB US TECHNIQUE: Both  transabdominal and transvaginal ultrasound examinations were performed for  complete evaluation of the gestation as well as the maternal uterus, adnexal regions, and pelvic cul-de-sac. Transvaginal technique was performed to assess early pregnancy. COMPARISON:  None for this gestation FINDINGS: Intrauterine gestational sac: Present, single Yolk sac:  Present Embryo:  Present Cardiac Activity: Present Heart Rate: 94 bpm CRL:  2.0 mm   5 w   5 d                  [place] EDC: 06/28/2019 Subchorionic hemorrhage:  Tiny subchronic hemorrhage Maternal uterus/adnexae: RIGHT ovary normal size and morphology 3.9 x 2.1 x 1.4 cm. LEFT ovary measures 3.3 x 2.7 x 3.2 cm and contains a small corpus luteum. No free pelvic fluid or adnexal masses. IMPRESSION: Single live intrauterine gestation at 5 weeks 5 days EGA. Tiny subchronic hemorrhage. Electronically Signed   By: Ulyses Southward M.D.   On: 10/31/2018 18:52    MAU COURSE Orders Placed This Encounter  Procedures  . Wet prep, genital  . Culture, OB Urine  . US OB LESS THAN 14 WEEKS WITH OB TRANSVAGINAL  . Urinalysis, Routine w reflex microscopic  . CBC  . hCG, quantitative, pregnancy  . HIV Antibody (routine testing w rflx)  . Pregnancy, urine POC  . ABO/Rh  . Discharge patient   No orders of the defined types were placed in this encounter.   MDM +UPT UA, wet prep, GC/chlamydia, CBC, ABO/Rh, quant hCG, and [place] today to rule out ectopic pregnancy  Ultrasound shows live IUP measuring [redacted]w[redacted]d. Initially pt reports LMP in February but now states she thinks she had a bleeding episode in March but is unsure. FHR <100 & "tiny" SCH seen. Discussed results with patient.   RH positive  ASSESSMENT 1. Normal IUP (intrauterine pregnancy) on prenatal ultrasound, first trimester   2. Abdominal pain during pregnancy in first trimester   3. Subchorionic hematoma in first trimester, single or unspecified fetus     PLAN Discharge home in stable condition. SAB  precautions Start prenatal care GC/CT pending  Allergies as of 10/31/2018      Reactions   Kiwi Extract Swelling   Mushroom Extract Complex Swelling   Pollen Extract Itching      Medication List    STOP taking these medications   celecoxib 100 MG capsule Commonly known as:  Alease Medina, NP 10/31/2018  7:59 PM

## 2018-11-01 LAB — HIV ANTIBODY (ROUTINE TESTING W REFLEX): HIV Screen 4th Generation wRfx: NONREACTIVE

## 2018-11-02 LAB — CULTURE, OB URINE: Culture: <10000 — AB

## 2018-11-03 LAB — GC/CHLAMYDIA PROBE AMP (~~LOC~~) NOT AT ARMC
Chlamydia: NEGATIVE
Neisseria Gonorrhea: NEGATIVE

## 2018-12-10 ENCOUNTER — Other Ambulatory Visit: Payer: Self-pay

## 2018-12-10 ENCOUNTER — Ambulatory Visit (INDEPENDENT_AMBULATORY_CARE_PROVIDER_SITE_OTHER): Payer: Medicaid Other | Admitting: Certified Nurse Midwife

## 2018-12-10 ENCOUNTER — Other Ambulatory Visit (HOSPITAL_COMMUNITY)
Admission: RE | Admit: 2018-12-10 | Discharge: 2018-12-10 | Disposition: A | Discharge status: Home / Self Care | Payer: Medicaid Other | Source: Ambulatory Visit | Attending: Certified Nurse Midwife | Admitting: Certified Nurse Midwife

## 2018-12-10 ENCOUNTER — Telehealth: Payer: Self-pay

## 2018-12-10 ENCOUNTER — Encounter: Payer: Self-pay | Admitting: Certified Nurse Midwife

## 2018-12-10 VITALS — BP 119/78 | HR 91 | Temp 99.3°F | Wt 138.4 lb

## 2018-12-10 DIAGNOSIS — Z3481 Encounter for supervision of other normal pregnancy, first trimester: Secondary | ICD-10-CM | POA: Diagnosis not present

## 2018-12-10 DIAGNOSIS — O98811 Other maternal infectious and parasitic diseases complicating pregnancy, first trimester: Secondary | ICD-10-CM

## 2018-12-10 DIAGNOSIS — Z3480 Encounter for supervision of other normal pregnancy, unspecified trimester: Secondary | ICD-10-CM | POA: Diagnosis not present

## 2018-12-10 DIAGNOSIS — Z3490 Encounter for supervision of normal pregnancy, unspecified, unspecified trimester: Secondary | ICD-10-CM | POA: Insufficient documentation

## 2018-12-10 DIAGNOSIS — Z3A11 11 weeks gestation of pregnancy: Secondary | ICD-10-CM

## 2018-12-10 DIAGNOSIS — B373 Candidiasis of vulva and vagina: Secondary | ICD-10-CM

## 2018-12-10 DIAGNOSIS — B3731 Acute candidiasis of vulva and vagina: Secondary | ICD-10-CM

## 2018-12-10 MED ORDER — BLOOD PRESSURE KIT: PACK | 0 refills | Status: DC

## 2018-12-10 MED ORDER — VITAFOL GUMMIES 3.33-0.333-34.8 MG PO CHEW: 3.0000 | CHEWABLE_TABLET | Freq: Every day | ORAL | 11 refills | Status: DC

## 2018-12-10 NOTE — Telephone Encounter (Signed)
Unable to reach pt for Webex visit. LVM

## 2018-12-10 NOTE — Progress Notes (Signed)
Pt presents for NOB visit.  This is not a planned pregnancy but FOB is supportive. Her daughter currently lives with FOB and his family.

## 2018-12-10 NOTE — Progress Notes (Signed)
History:   ELLEANNA MELLING is a 23 y.o. G2P1001 at 53w3dby early ultrasound being seen today for her first obstetrical visit.  Her obstetrical history is significant for VAVD x1 in [address] no complications antepartum or intrapartum. Patient does intend to breast feed. Pregnancy history fully reviewed. This is an unplanned pregnancy but FOB is supportive. Daughter is living with FOB's family.   Patient reports no complaints.     HISTORY: OB History  Gravida Para Term Preterm AB Living  2 1 1  0 0 1  SAB TAB Ectopic Multiple Live Births  0 0 0 0 1    # Outcome Date GA Lbr Len/2nd Weight Sex Delivery Anes PTL Lv  2 Current           1 Term 2018 342w0d F Vag-Vacuum   LIV   She has never had pap before.   Past Medical History:  Diagnosis Date  . GBS bacteriuria 02/23/2016   Will need prophylaxis in labor  . Medical history non-contributory   . Supervision of normal first pregnancy, antepartum 02/14/2016    Clinic  [facility] Prenatal Labs Dating  7 week sono Blood type: O/Positive/-- (08/15 1531) O pos Genetic Screen 1 Screen: nl NT   AFP: neg     Antibody:Negative (08/15 1531)neg Anatomic USKoreanormal  Rubella: 1.96 (08/15 1531)Immune GTT  Third trimester: nl 2hr RPR: Non Reactive (08/15 1531) NR Flu vaccine  declined HBsAg: Negative (08/15 1531) NEg TDaP vaccine                                          Past Surgical History:  Procedure Laterality Date  . TONSILLECTOMY     History reviewed. No pertinent family history. Social History   Tobacco Use  . Smoking status: Never Smoker  . Smokeless tobacco: Never Used  Substance Use Topics  . Alcohol use: No  . Drug use: No   Allergies  Allergen Reactions  . Kiwi Extract Swelling  . Mushroom Extract Complex Swelling  . Pollen Extract Itching   No current outpatient medications on file prior to visit.   No current facility-administered medications on file prior to visit.     Review of Systems Pertinent items noted in HPI and  remainder of comprehensive ROS otherwise negative. Physical Exam:   Vitals:   12/10/18 1342  BP: 119/78  Pulse: 91  Temp: 99.3 F (37.4 C)  Weight: 138 lb 6.4 oz (62.8 kg)   Fetal Heart Rate (bpm): 142 Pelvic Exam: Perineum: no hemorrhoids, normal perineum   Vulva: normal external genitalia, no lesions   Vagina:  normal mucosa, small amount of white discharge present and adherent on cervix   Cervix: no lesions and normal, pap smear done.    Adnexa: normal adnexa and no mass, fullness, tenderness   Bony Pelvis: average  System: General: well-developed, well-nourished female in no acute distress   Skin: normal coloration and turgor, no rashes   Neurologic: oriented, normal, negative, normal mood   Extremities: normal strength, tone, and muscle mass, ROM of all joints is normal   HEENT PERRLA, extraocular movement intact and sclera clear   Mouth/Teeth mucous membranes moist, pharynx normal without lesions and dental hygiene good   Neck supple and no masses   Cardiovascular: regular rate and rhythm   Respiratory:  no respiratory distress, normal breath sounds   Abdomen:  soft, non-tender; bowel sounds normal; no masses,  no organomegaly     Assessment:    Pregnancy: G2P1001 Patient Active Problem List   Diagnosis Date Noted  . Encounter for supervision of normal pregnancy, antepartum 12/10/2018     Plan:    1. Supervision of other normal pregnancy, antepartum - Welcomed to practice and introduced self to patient  - Anticipatory guidance on upcoming appointments including telehealth appointments d/t COVID  - COVID 19 precautions discussed - Discussed use of babyscripts app and patient will receive BP cuff in mail, patient verbalizes understanding  - Obstetric Panel, Including HIV - Culture, OB Urine - Genetic Screening - [facility] AMB BABYSCRIPTS SCHEDULE OPTIMIZATION - Cervicovaginal ancillary only( [place]) - Cytology - PAP( [place]) - Prenatal Vit-Fe Phos-FA-Omega  (VITAFOL GUMMIES) 3.33-0.333-34.8 MG CHEW; Chew 3 tablets by mouth daily.  Dispense: 90 tablet; Refill: 11 - [place] MFM OB COMP + 14 WK; Future - Blood Pressure KIT; Monitor blood pressure at home regularly z34.90 standard size cuff  Dispense: 1 each; Refill: 0   Initial labs drawn. Rx for prenatal vitamins. Genetic Screening discussed, NIPS: ordered. Ultrasound discussed; fetal anatomic survey: ordered. Problem list reviewed and updated. The nature of [place] with multiple MDs and other Advanced Practice Providers was explained to patient; also emphasized that residents, students are part of our team. Routine obstetric precautions reviewed. Return in about 7 weeks (around 01/28/2019) for ROB-webex.     Lajean Manes, [place] for [employer], [place]

## 2018-12-10 NOTE — Patient Instructions (Signed)
Childbirth Education Options: [facility] Classes:  Childbirth education classes can help you get ready for a positive parenting experience. You can also meet other expectant parents and get free stuff for your baby. Each class runs for five weeks on the same night and costs $45 for the mother-to-be and her support person. Medicaid covers the cost if you are eligible. Call [phone] to register. [facility] Childbirth Education:  [phone] or [phone] or [email or web address]  Baby & Me Class: Discuss newborn & infant parenting and family adjustment issues with other new mothers in a relaxed environment. Each week brings a new speaker or baby-centered activity. We encourage new mothers to join us every Thursday at 11:00am. Babies birth until crawling. No registration or fee. Daddy Boot Camp: This course offers Dads-to-be the tools and knowledge needed to feel confident on their journey to becoming new fathers. Experienced dads, who have been trained as coaches, teach dads-to-be how to hold, comfort, diaper, swaddle and play with their infant while being able to support the new mom as well. A class for men taught by men. $25/dad Big Brother/Big Sister: Let your children share in the joy of a new brother or sister in this special class designed just for them. Class includes discussion about how families care for babies: swaddling, holding, diapering, safety as well as how they can be helpful in their new role. This class is designed for children ages 2 to 6, but any age is welcome. Please register each child individually. $5/child  Mom Talk: This mom-led group offers support and connection to mothers as they journey through the adjustments and struggles of that sometimes overwhelming first year after the birth of a child. Tuesdays at 10:00am and Thursdays at 6:00pm. Babies welcome. No registration or fee. Breastfeeding Support Group: This group is a mother-to-mother  support circle where moms have the opportunity to share their breastfeeding experiences. A Lactation Consultant is present for questions and concerns. Meets each Tuesday at 11:00am. No fee or registration. Breastfeeding Your Baby: Learn what to expect in the first days of breastfeeding your newborn.  This class will help you feel more confident with the skills needed to begin your breastfeeding experience. Many new mothers are concerned about breastfeeding after leaving the hospital. This class will also address the most common fears and challenges about breastfeeding during the first few weeks, months and beyond. (call for fee) Comfort Techniques and Tour: This 2 hour interactive class will provide you the opportunity to learn & practice hands-on techniques that can help relieve some of the discomfort of labor and encourage your baby to rotate toward the best position for birth. You and your partner will be able to try a variety of labor positions with birth balls and rebozos as well as practice breathing, relaxation, and visualization techniques. A tour of the [facility] is included with this class. $20 per registrant and support person Childbirth Class- Weekend Option: This class is a Weekend version of our Birth & Baby series. It is designed for parents who have a difficult time fitting several weeks of classes into their schedule. It covers the care of your newborn and the basics of labor and childbirth. It also includes a Maternity Care Center Tour of [facility] and lunch. The class is held two consecutive days: beginning on Friday evening from 6:30 - 8:30 p.m. and the next day, Saturday from 9 a.m. - 4 p.m. (call for fee) Waterbirth Class: Interested in a waterbirth?  This   informational class will help you discover whether waterbirth is the right fit for you. Education about waterbirth itself, supplies you would need and how to assemble your support team is what you can  expect from this class. Some obstetrical practices require this class in order to pursue a waterbirth. (Not all obstetrical practices offer waterbirth-check with your healthcare provider.) Register only the expectant mom, but you are encouraged to bring your partner to class! Required if planning waterbirth, no fee. Infant/Child CPR: Parents, grandparents, babysitters, and friends learn Cardio-Pulmonary Resuscitation skills for infants and children. You will also learn how to treat both conscious and unconscious choking in infants and children. This Family & Friends program does not offer certification. Register each participant individually to ensure that enough mannequins are available. (Call for fee) Grandparent Love: Expecting a grandbaby? This class is for you! Learn about the latest infant care and safety recommendations and ways to support your own child as he or she transitions into the parenting role. Taught by Registered Nurses who are childbirth instructors, but most importantly...they are grandmothers too! $10/person. Childbirth Class- Natural Childbirth: This series of 5 weekly classes is for expectant parents who want to learn and practice natural methods of coping with the process of labor and childbirth. Relaxation, breathing, massage, visualization, role of the partner, and helpful positioning are highlighted. Participants learn how to be confident in their body's ability to give birth. This class will empower and help parents make informed decisions about their own care. Includes discussion that will help new parents transition into the immediate postpartum period. Maternity Care Center Tour of [facility] is included. We suggest taking this class between 25-32 weeks, but it's only a recommendation. $75 per registrant and one support person or $30 Medicaid. Childbirth Class- 3 week Series: This option of 3 weekly classes helps you and your labor partner prepare for childbirth. Newborn  care, labor & birth, cesarean birth, pain management, and comfort techniques are discussed and a Maternity Care Center Tour of [facility] is included. The class meets at the same time, on the same day of the week for 3 consecutive weeks beginning with the starting date you choose. $60 for registrant and one support person.  Marvelous Multiples: Expecting twins, triplets, or more? This class covers the differences in labor, birth, parenting, and breastfeeding issues that face multiples' parents. NICU tour is included. Led by a Certified Childbirth Educator who is the mother of twins. No fee. Caring for Baby: This class is for expectant and adoptive parents who want to learn and practice the most up-to-date newborn care for their babies. Focus is on birth through the first six weeks of life. Topics include feeding, bathing, diapering, crying, umbilical cord care, circumcision care and safe sleep. Parents learn to recognize symptoms of illness and when to call the pediatrician. Register only the mom-to-be and your partner or support person can plan to come with you! $10 per registrant and support person Childbirth Class- online option: This online class offers you the freedom to complete a Birth and Baby series in the comfort of your own home. The flexibility of this option allows you to review sections at your own pace, at times convenient to you and your support people. It includes additional video information, animations, quizzes, and extended activities. Get organized with helpful eClass tools, checklists, and trackers. Once you register online for the class, you will receive an email within a few days to accept the invitation and begin the class when the time   is right for you. The content will be available to you for 60 days. $60 for 60 days of online access for you and your support people.  Local Doulas: Natural Baby Doulas [email or web address] Tel:  [phone] [email or web address] [facility] [phone] [email or web address] [employer]  (also do waterbirth tub rental) [phone] [email or web address] [facility] [phone] [email or web address] [employer]  [phone] [email or web address] [place] [employer] (PADA) [email or web address].org/index.htm [place] [facility]  [email or web address] Considering Waterbirth? Guide for patients at [facility]  Why consider waterbirth?  . Gentle birth for babies . Less pain medicine used in labor . May allow for passive descent/less pushing . May reduce perineal tears  . More mobility and instinctive maternal position changes . Increased maternal relaxation . Reduced blood pressure in labor  Is waterbirth safe? What are the risks of infection, drowning or other complications?  . Infection: o Very low risk (3.7 % for tub vs 4.8% for bed) o 7 in 8000 waterbirths with documented infection o Poorly cleaned equipment most common cause o Slightly lower group B strep transmission rate  . Drowning o Maternal:  - Very low risk   - Related to seizures or fainting o Newborn:  - Very low risk. No evidence of increased risk of respiratory problems in multiple large studies - Physiological protection from breathing under water - Avoid underwater birth if there are any fetal complications - Once baby's head is out of the water, keep it out.  . Birth complication o Some reports of cord trauma, but risk decreased by bringing baby to surface gradually o No evidence of increased risk of shoulder dystocia. Mothers can usually change positions faster in water than in a bed, possibly aiding the maneuvers to free the shoulder.   You must attend a Waterbirth class at [facility]  3rd Wednesday of every month from 7-9pm  Free  Register by calling [phone] or online at [email or web address]  Bring us the certificate from the class to your prenatal appointment  Meet with a midwife at 36 weeks to see if you can still plan a waterbirth and to sign the consent.   Purchase or rent the following supplies:   Water Birth Pool (Birth Pool in a Box or [place] for instance)  (Tubs start ~$125)  Single-use disposable tub liner designed for your brand of tub  New garden hose labeled "lead-free", "suitable for drinking water",  Electric drain pump to remove water (We recommend 792 gallon per hour or greater pump.)   Separate garden hose to remove the dirty water  Fish net  Bathing suit top (optional)  Long-handled mirror (optional)  Places to purchase or rent supplies  [email or web address] for tub purchases and supplies  Waterbirthsolutions.[email or web address] for tub purchases and supplies  [employer] ([email or web address]) $275 for tub rental/set-up & take down/kit   [place] [employer] (http://www.padanc.org/MeetUs.htm) Information regarding doulas (labor support) who provide pool rentals  Our practice has a Birth Pool in a Box tub at the hospital that you may borrow on a first-come-first-served basis. It is your responsibility to to set up, clean and break down the tub. We cannot guarantee the availability of this tub in advance. You are responsible for bringing all accessories listed above. If you do not have all necessary supplies you cannot have a waterbirth.    Things that would prevent you from having a waterbirth:  Premature, <37wks  Previous cesarean birth  Presence of thick meconium-stained fluid  Multiple gestation (Twins,   triplets, etc.)  Uncontrolled diabetes or gestational diabetes requiring medication  Hypertension requiring medication or diagnosis of pre-eclampsia  Heavy vaginal bleeding  Non-reassuring fetal  heart rate  Active infection (MRSA, etc.). Group B Strep is NOT a contraindication for  waterbirth.  If your labor has to be induced and induction method requires continuous  monitoring of the baby's heart rate  Other risks/issues identified by your obstetrical provider  Please remember that birth is unpredictable. Under certain unforeseeable circumstances your provider may advise against giving birth in the tub. These decisions will be made on a case-by-case basis and with the safety of you and your baby as our highest priority.   Safe Medications in Pregnancy   Acne:  Benzoyl Peroxide  Salicylic Acid   Backache/Headache:  Tylenol: 2 regular strength every 4 hours OR        2 Extra strength every 6 hours   Colds/Coughs/Allergies:  Benadryl (alcohol free) 25 mg every 6 hours as needed  Breath right strips  Claritin  Cepacol throat lozenges  Chloraseptic throat spray  Cold-Eeze- up to three times per day  Cough drops, alcohol free  Flonase (by prescription only)  Guaifenesin  Mucinex  Robitussin DM (plain only, alcohol free)  Saline nasal spray/drops  Sudafed (pseudoephedrine) & Actifed * use only after [redacted] weeks gestation and if you do not have high blood pressure  Tylenol  Vicks Vaporub  Zinc lozenges  Zyrtec   Constipation:  Colace  Ducolax suppositories  Fleet enema  Glycerin suppositories  Metamucil  Milk of magnesia  Miralax  Senokot  Smooth move tea   Diarrhea:  Kaopectate  Imodium A-D   *NO pepto Bismol   Hemorrhoids:  Anusol  Anusol HC  Preparation H  Tucks   Indigestion:  Tums  Maalox  Mylanta  Zantac  Pepcid   Insomnia:  Benadryl (alcohol free) 25mg every 6 hours as needed  Tylenol PM  Unisom, no Gelcaps   Leg Cramps:  Tums  MagGel   Nausea/Vomiting:  Bonine  Dramamine  Emetrol  Ginger extract  Sea bands  Meclizine  Nausea medication to take during pregnancy:  Unisom (doxylamine succinate 25 mg tablets) Take one  tablet daily at bedtime. If symptoms are not adequately controlled, the dose can be increased to a maximum recommended dose of two tablets daily (1/2 tablet in the morning, 1/2 tablet mid-afternoon and one at bedtime).  Vitamin B6 100mg tablets. Take one tablet twice a day (up to 200 mg per day).   Skin Rashes:  Aveeno products  Benadryl cream or 25mg every 6 hours as needed  Calamine Lotion  1% cortisone cream   Yeast infection:  Gyne-lotrimin 7  Monistat 7    **If taking multiple medications, please check labels to avoid duplicating the same active ingredients  **take medication as directed on the label  ** Do not exceed 4000 mg of tylenol in 24 hours  **Do not take medications that contain aspirin or ibuprofen            

## 2018-12-11 LAB — CERVICOVAGINAL ANCILLARY ONLY
Bacterial vaginitis: POSITIVE — AB
Candida vaginitis: POSITIVE — AB
Chlamydia: NEGATIVE
Neisseria Gonorrhea: NEGATIVE
Trichomonas: NEGATIVE

## 2018-12-12 LAB — OBSTETRIC PANEL, INCLUDING HIV
Antibody Screen: NEGATIVE
Basophils Absolute: 0 10*3/uL (ref 0.0–0.2)
Basos: 0 %
EOS (ABSOLUTE): 0.1 10*3/uL (ref 0.0–0.4)
Eos: 1 %
HIV Screen 4th Generation wRfx: NONREACTIVE
Hematocrit: 36.2 % (ref 34.0–46.6)
Hemoglobin: 12.4 g/dL (ref 11.1–15.9)
Hepatitis B Surface Ag: NEGATIVE
Immature Grans (Abs): 0.1 10*3/uL (ref 0.0–0.1)
Immature Granulocytes: 1 %
Lymphocytes Absolute: 2.2 10*3/uL (ref 0.7–3.1)
Lymphs: 21 %
MCH: 30.6 pg (ref 26.6–33.0)
MCHC: 34.3 g/dL (ref 31.5–35.7)
MCV: 89 fL (ref 79–97)
Monocytes Absolute: 0.6 10*3/uL (ref 0.1–0.9)
Monocytes: 6 %
Neutrophils Absolute: 7.4 10*3/uL — ABNORMAL HIGH (ref 1.4–7.0)
Neutrophils: 71 %
Platelets: 341 10*3/uL (ref 150–450)
RBC: 4.05 x10E6/uL (ref 3.77–5.28)
RDW: 12.7 % (ref 11.7–15.4)
RPR Ser Ql: NONREACTIVE
Rh Factor: POSITIVE
Rubella Antibodies, IGG: 2.07 index (ref >0.99)
WBC: 10.4 10*3/uL (ref 3.4–10.8)

## 2018-12-12 LAB — URINE CULTURE, OB REFLEX

## 2018-12-12 LAB — CULTURE, OB URINE

## 2018-12-16 LAB — CYTOLOGY - PAP: HPV: NOT DETECTED

## 2018-12-16 MED ORDER — TERCONAZOLE 0.8 % VA CREA: 1.0000 | TOPICAL_CREAM | Freq: Every day | VAGINAL | 0 refills | Status: DC

## 2018-12-16 NOTE — Addendum Note (Signed)
Addended by: Lajean Manes on: 12/16/2018 07:41 AM   Modules accepted: Orders

## 2018-12-22 ENCOUNTER — Encounter: Payer: Self-pay | Admitting: Certified Nurse Midwife

## 2018-12-24 ENCOUNTER — Encounter: Payer: Self-pay | Admitting: Obstetrics and Gynecology

## 2018-12-24 DIAGNOSIS — R87611 Atypical squamous cells cannot exclude high grade squamous intraepithelial lesion on cytologic smear of cervix (ASC-H): Secondary | ICD-10-CM | POA: Insufficient documentation

## 2019-01-21 ENCOUNTER — Other Ambulatory Visit: Payer: Self-pay

## 2019-01-21 ENCOUNTER — Ambulatory Visit (HOSPITAL_COMMUNITY)
Admission: RE | Admit: 2019-01-21 | Discharge: 2019-01-21 | Disposition: A | Discharge status: Home / Self Care | Payer: Medicaid Other | Source: Ambulatory Visit | Attending: Obstetrics and Gynecology | Admitting: Obstetrics and Gynecology

## 2019-01-21 ENCOUNTER — Other Ambulatory Visit (HOSPITAL_COMMUNITY): Payer: Self-pay | Admitting: *Deleted

## 2019-01-21 DIAGNOSIS — Z3480 Encounter for supervision of other normal pregnancy, unspecified trimester: Secondary | ICD-10-CM | POA: Insufficient documentation

## 2019-01-21 DIAGNOSIS — Z3A17 17 weeks gestation of pregnancy: Secondary | ICD-10-CM

## 2019-01-21 DIAGNOSIS — Z363 Encounter for antenatal screening for malformations: Secondary | ICD-10-CM

## 2019-01-21 DIAGNOSIS — Z362 Encounter for other antenatal screening follow-up: Secondary | ICD-10-CM

## 2019-01-27 ENCOUNTER — Encounter (HOSPITAL_COMMUNITY): Payer: Self-pay | Admitting: *Deleted

## 2019-01-27 ENCOUNTER — Other Ambulatory Visit: Payer: Self-pay

## 2019-01-27 ENCOUNTER — Inpatient Hospital Stay (HOSPITAL_COMMUNITY)
Admission: AD | Admit: 2019-01-27 | Discharge: 2019-01-28 | Disposition: A | Discharge status: Home / Self Care | Payer: Medicaid Other | Attending: Family Medicine | Admitting: Family Medicine

## 2019-01-27 DIAGNOSIS — B9689 Other specified bacterial agents as the cause of diseases classified elsewhere: Secondary | ICD-10-CM

## 2019-01-27 DIAGNOSIS — Z3A18 18 weeks gestation of pregnancy: Secondary | ICD-10-CM | POA: Diagnosis not present

## 2019-01-27 DIAGNOSIS — O26892 Other specified pregnancy related conditions, second trimester: Secondary | ICD-10-CM | POA: Diagnosis not present

## 2019-01-27 DIAGNOSIS — R109 Unspecified abdominal pain: Secondary | ICD-10-CM | POA: Diagnosis present

## 2019-01-27 LAB — WET PREP, GENITAL
Sperm: NONE SEEN
Trich, Wet Prep: NONE SEEN
Yeast Wet Prep HPF POC: NONE SEEN

## 2019-01-27 LAB — URINALYSIS, ROUTINE W REFLEX MICROSCOPIC
Bilirubin Urine: NEGATIVE
Glucose, UA: NEGATIVE mg/dL
Hgb urine dipstick: NEGATIVE
Ketones, ur: NEGATIVE mg/dL
Nitrite: NEGATIVE
Protein, ur: 100 mg/dL — AB
Specific Gravity, Urine: 1.023 (ref 1.005–1.030)
pH: 7 (ref 5.0–8.0)

## 2019-01-27 MED ORDER — ACETAMINOPHEN 500 MG PO TABS
1000.0000 mg | ORAL_TABLET | Freq: Once | ORAL | Status: AC
  Administered 2019-01-28: 1000 mg via ORAL
  Filled 2019-01-27: qty 2

## 2019-01-27 NOTE — MAU Provider Note (Signed)
History     CSN: [id number]  Arrival date and time: 01/27/19 1194   First Provider Initiated Contact with Patient 01/27/19 2314      Chief Complaint  Patient presents with  . Abdominal Pain   Tanya Rodgers is a 23 y.o. G2P1001 at 61w2dwho receives care at [facility].  She presents today for Abdominal Pain that started around 0800.  She describes the pain as an intermittent cramping that she rates a 9/10 when it occurs.  Patient states she has not taken anything for the pain. Patient endorses fetal movement and denies vaginal concerns including discharge, leaking, or bleeding.       OB History    Gravida  2   Para  1   Term  1   Preterm      AB      Living  1     SAB      TAB      Ectopic      Multiple      Live Births  1           Past Medical History:  Diagnosis Date  . GBS bacteriuria 02/23/2016   Will need prophylaxis in labor  . Medical history non-contributory   . Supervision of normal first pregnancy, antepartum 02/14/2016    Clinic  [facility] Prenatal Labs Dating  7 week sono Blood type: O/Positive/-- (08/15 1531) O pos Genetic Screen 1 Screen: nl NT   AFP: neg     Antibody:Negative (08/15 1531)neg Anatomic [place] normal  Rubella: 1.96 (08/15 1531)Immune GTT  Third trimester: nl 2hr RPR: Non Reactive (08/15 1531) NR Flu vaccine  declined HBsAg: Negative (08/15 1531) NEg TDaP vaccine                                           Past Surgical History:  Procedure Laterality Date  . TONSILLECTOMY      History reviewed. No pertinent family history.  Social History   Tobacco Use  . Smoking status: Never Smoker  . Smokeless tobacco: Never Used  Substance Use Topics  . Alcohol use: No  . Drug use: No    Allergies:  Allergies  Allergen Reactions  . Kiwi Extract Swelling  . Mushroom Extract Complex Swelling  . Pollen Extract Itching    Medications Prior to Admission  Medication Sig Dispense Refill Last Dose  . Prenatal Vit-Fe Phos-FA-Omega  (VITAFOL GUMMIES) 3.33-0.333-34.8 MG CHEW Chew 3 tablets by mouth daily. 90 tablet 11 01/26/2019 at Unknown time  . Blood Pressure KIT Monitor blood pressure at home regularly z34.90 standard size cuff 1 each 0 Unknown at Unknown time  . terconazole (TERAZOL 3) 0.8 % vaginal cream Place 1 applicator vaginally at bedtime. 20 g 0     Review of Systems  Constitutional: Negative for chills and fever.  Respiratory: Negative for cough and shortness of breath.   Gastrointestinal: Positive for abdominal pain. Negative for constipation, diarrhea, nausea and vomiting.  Genitourinary: Negative for difficulty urinating, dysuria, vaginal bleeding and vaginal discharge.  Musculoskeletal: Positive for back pain.  Neurological: Positive for headaches (8/10). Negative for dizziness and light-headedness.   Physical Exam   Blood pressure 109/62, pulse 89, temperature 98.7 F (37.1 C), temperature source Oral, resp. rate 18, height 5' 1"  (1.549 m), weight 63.3 kg, last menstrual period 08/22/2018, unknown if currently breastfeeding.  Physical Exam  Constitutional: She is oriented to person, place, and time. She appears well-developed and well-nourished. No distress.  HENT:  Head: Normocephalic and atraumatic.  Eyes: Conjunctivae are normal.  Neck: Normal range of motion.  Cardiovascular: Normal rate, regular rhythm and normal heart sounds.  Respiratory: Effort normal and breath sounds normal.  GI: Soft. Bowel sounds are normal. There is no abdominal tenderness.  Genitourinary: Cervix exhibits no discharge.    Vaginal discharge present.     No vaginal bleeding.  No bleeding in the vagina.    Genitourinary Comments: Speculum Exam: -Vaginal Vault: Pink mucosa.  Moderate amt white frothy discharge in vault -wet prep collected -Cervix:Pink, no lesions, cysts, or polyps.  Appears closed. No active bleeding from os-GC/CT collected -Bimanual Exam: Closed    Musculoskeletal: Normal range of motion.   Neurological: She is alert and oriented to person, place, and time.  Skin: Skin is warm and dry.  Psychiatric: She has a normal mood and affect. Her behavior is normal.    MAU Course  Procedures Results for orders placed or performed during the hospital encounter of 01/27/19 (from the past 24 hour(s))  Urinalysis, Routine w reflex microscopic     Status: Abnormal   Collection Time: 01/27/19  8:21 PM  Result Value Ref Range   Color, Urine YELLOW YELLOW   APPearance HAZY (A) CLEAR   Specific Gravity, Urine 1.023 1.005 - 1.030   pH 7.0 5.0 - 8.0   Glucose, UA NEGATIVE NEGATIVE mg/dL   Hgb urine dipstick NEGATIVE NEGATIVE   Bilirubin Urine NEGATIVE NEGATIVE   Ketones, ur NEGATIVE NEGATIVE mg/dL   Protein, ur 100 (A) NEGATIVE mg/dL   Nitrite NEGATIVE NEGATIVE   Leukocytes,Ua TRACE (A) NEGATIVE   RBC / HPF 0-5 0 - 5 RBC/hpf   WBC, UA 11-20 0 - 5 WBC/hpf   Bacteria, UA MANY (A) NONE SEEN   Squamous Epithelial / LPF 11-20 0 - 5   Mucus PRESENT   Wet prep, genital     Status: Abnormal   Collection Time: 01/27/19 11:30 PM   Specimen: Thin Prep Cervical/Endocervical  Result Value Ref Range   Yeast Wet Prep HPF POC NONE SEEN NONE SEEN   Trich, Wet Prep NONE SEEN NONE SEEN   Clue Cells Wet Prep HPF POC PRESENT (A) NONE SEEN   WBC, Wet Prep HPF POC MANY (A) NONE SEEN   Sperm NONE SEEN     MDM Pelvic Exam with cultures Labs: UA, Wet prep, and GC/CT Pain Medication Assessment and Plan  23 year old G2P1001 at 18.2 weeks Abdominal Cramping  -Exam findings discussed. -Informed that findings c/w BV, but would send labs.  -Cultures collected. -Offered and accepts pain medication. Given tylenol xr.  -FOB questions regarding fetal growth addressed and [place] from 7/22 reviewed. *Educated on IUGR and need for follow up US to assess fetal growth. -No other questions or concerns. -Will await results.   Follow Up (12:27 AM) Bacterial Vaginosis   -Wet prep returns significant for clue  cells. -Results discussed with patient. -Rx for metronidazole gel sent to pharmacy on file.  -Discussed proper usage of medications. -Instructed to keep appt as scheduled.  Further informed that appt is via webex/mychart and not in person. -No other questions or concerns. -Encouraged to call or return to MAU if symptoms worsen or with the onset of new symptoms. -Discharged to home in stable condition.   Maryann Conners MSN, CNM 01/27/2019, 11:14 PM

## 2019-01-27 NOTE — MAU Note (Signed)
Front desk called stated pt wants to leave AMA.

## 2019-01-27 NOTE — MAU Note (Signed)
PT SAYS AT 12NOON- WAS LAYING IN BED- LOWER ABD  AND SIDES STARTED HURTING. CALLED MOM - SHE BROUGHT HER HERE .  GETS PNC WITH [facility] - PLANS TO DELIVER  HERE.   STILL HURTS THE SAME.  LAST SEX- WHEN GOT PREG.

## 2019-01-28 ENCOUNTER — Encounter: Payer: Self-pay | Admitting: Certified Nurse Midwife

## 2019-01-28 ENCOUNTER — Ambulatory Visit (INDEPENDENT_AMBULATORY_CARE_PROVIDER_SITE_OTHER): Payer: Medicaid Other | Admitting: Certified Nurse Midwife

## 2019-01-28 DIAGNOSIS — O365920 Maternal care for other known or suspected poor fetal growth, second trimester, not applicable or unspecified: Secondary | ICD-10-CM

## 2019-01-28 DIAGNOSIS — Z3480 Encounter for supervision of other normal pregnancy, unspecified trimester: Secondary | ICD-10-CM

## 2019-01-28 DIAGNOSIS — Z3A18 18 weeks gestation of pregnancy: Secondary | ICD-10-CM | POA: Diagnosis not present

## 2019-01-28 DIAGNOSIS — O365990 Maternal care for other known or suspected poor fetal growth, unspecified trimester, not applicable or unspecified: Secondary | ICD-10-CM | POA: Insufficient documentation

## 2019-01-28 MED ORDER — METRONIDAZOLE 0.75 % VA GEL: 1.0000 | Freq: Every day | VAGINAL | 0 refills | Status: DC

## 2019-01-28 NOTE — Progress Notes (Signed)
Pt has not gotten BP cuff, will reorder today.

## 2019-01-28 NOTE — Discharge Instructions (Signed)
Abdominal Pain During Pregnancy ° °Abdominal pain is common during pregnancy, and has many possible causes. Some causes are more serious than others, and sometimes the cause is not known. Abdominal pain can be a sign that labor is starting. It can also be caused by normal growth and stretching of muscles and ligaments during pregnancy. Always tell your health care provider if you have any abdominal pain. °Follow these instructions at home: °· Do not have sex or put anything in your vagina until your pain goes away completely. °· Get plenty of rest until your pain improves. °· Drink enough fluid to keep your urine pale yellow. °· Take over-the-counter and prescription medicines only as told by your health care provider. °· Keep all follow-up visits as told by your health care provider. This is important. °Contact a health care provider if: °· Your pain continues or gets worse after resting. °· You have lower abdominal pain that: °? Comes and goes at regular intervals. °? Spreads to your back. °? Is similar to menstrual cramps. °· You have pain or burning when you urinate. °Get help right away if: °· You have a fever or chills. °· You have vaginal bleeding. °· You are leaking fluid from your vagina. °· You are passing tissue from your vagina. °· You have vomiting or diarrhea that lasts for more than 24 hours. °· Your baby is moving less than usual. °· You feel very weak or faint. °· You have shortness of breath. °· You develop severe pain in your upper abdomen. °Summary °· Abdominal pain is common during pregnancy, and has many possible causes. °· If you experience abdominal pain during pregnancy, tell your health care provider right away. °· Follow your health care provider's home care instructions and keep all follow-up visits as directed. °This information is not intended to replace advice given to you by your health care provider. Make sure you discuss any questions you have with your health care  provider. °Document Released: 06/18/2005 Document Revised: 10/06/2018 Document Reviewed: 09/20/2016 °Elsevier Patient Education © 2020 [employer] ° °

## 2019-01-28 NOTE — Progress Notes (Signed)
   TELEHEALTH VIRTUAL OBSTETRICS VISIT ENCOUNTER NOTE  I connected with Tanya Rodgers on 01/28/19 at  3:51 PM EDT by telephone at home and verified that I am speaking with the correct person using two identifiers.   I discussed the limitations, risks, security and privacy concerns of performing an evaluation and management service by telephone and the availability of in person appointments. I also discussed with the patient that there may be a patient responsible charge related to this service. The patient expressed understanding and agreed to proceed.  Subjective:  Tanya Rodgers is a 23 y.o. G2P1001 at [redacted]w[redacted]d being followed for ongoing prenatal care.  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  Patient reports no complaints. Reports fetal movement. Denies any contractions, bleeding or leaking of fluid.   The following portions of the patient's history were reviewed and updated as appropriate: allergies, current medications, past family history, past medical history, past social history, past surgical history and problem list.   Objective:   General:  Alert, oriented and cooperative.   Mental Status: Normal mood and affect perceived. Normal judgment and thought content.  Rest of physical exam deferred due to type of encounter  Assessment and Plan:  Pregnancy: G2P1001 at [redacted]w[redacted]d 1. Supervision of other normal pregnancy, antepartum - Patient doing well, no complaints - Was seen in the MAU yesterday for abdominal pain, reports pain has resolved  - Anticipatory guidance on upcoming appointments with next one being in 4 weeks  - COVID19 precautions reviewed   2. Intrauterine growth restriction (IUGR) affecting care of mother, second trimester,  single or unspecified fetus - Patient now high risk d/t IUGR  - IUGR EFW 7th% with normal AC at anatomy - dx on 7/22 at anatomy US , f/u growth in 4 weeks  - Educated on IUGR   Preterm labor symptoms and general obstetric precautions including but not limited to vaginal bleeding, contractions, leaking of fluid and fetal movement were reviewed in detail with the patient.  I discussed the assessment and treatment plan with the patient. The patient was provided an opportunity to ask questions and all were answered. The patient agreed with the plan and demonstrated an understanding of the instructions. The patient was advised to call back or seek an in-person office evaluation/go to MAU at [facility] for any urgent or concerning symptoms. Please refer to After Visit Summary for other counseling recommendations.   I provided 7 minutes of non-face-to-face time during this encounter.  Return in about 4 weeks (around 02/25/2019) for ROB/IUGR in person .  Future Appointments  Date Time Provider [place]  02/18/2019  2:45 PM [place] NURSE [place] [facility]  02/18/2019  2:45 PM [place], [place] for [employer], [place]

## 2019-01-29 LAB — CULTURE, OB URINE

## 2019-01-29 LAB — GC/CHLAMYDIA PROBE AMP (~~LOC~~) NOT AT ARMC
Chlamydia: NEGATIVE
Neisseria Gonorrhea: NEGATIVE

## 2019-02-18 ENCOUNTER — Ambulatory Visit (HOSPITAL_COMMUNITY): Payer: Medicaid Other

## 2019-02-25 ENCOUNTER — Other Ambulatory Visit: Payer: Self-pay

## 2019-02-25 ENCOUNTER — Ambulatory Visit (INDEPENDENT_AMBULATORY_CARE_PROVIDER_SITE_OTHER): Payer: Medicaid Other | Admitting: Obstetrics & Gynecology

## 2019-02-25 VITALS — BP 107/69 | HR 85 | Wt 139.0 lb

## 2019-02-25 DIAGNOSIS — Z3480 Encounter for supervision of other normal pregnancy, unspecified trimester: Secondary | ICD-10-CM

## 2019-02-25 DIAGNOSIS — Z3A22 22 weeks gestation of pregnancy: Secondary | ICD-10-CM

## 2019-02-25 DIAGNOSIS — O365920 Maternal care for other known or suspected poor fetal growth, second trimester, not applicable or unspecified: Secondary | ICD-10-CM

## 2019-02-25 LAB — POCT URINALYSIS DIPSTICK
Bilirubin, UA: NEGATIVE
Blood, UA: NEGATIVE
Glucose, UA: NEGATIVE
Ketones, UA: NEGATIVE
Leukocytes, UA: NEGATIVE
Nitrite, UA: NEGATIVE
Protein, UA: POSITIVE — AB
Spec Grav, UA: 1.02 (ref 1.010–1.025)
Urobilinogen, UA: 2 E.U./dL — AB
pH, UA: 6.5 (ref 5.0–8.0)

## 2019-02-25 NOTE — Addendum Note (Signed)
Addended by: Courtney Heys on: 02/25/2019 03:49 PM   Modules accepted: Orders

## 2019-02-25 NOTE — Progress Notes (Signed)
Patient reports fetal movement, denies pain. 

## 2019-02-25 NOTE — Progress Notes (Signed)
    Subjective:  Tanya Rodgers is a 23 y.o. G2P1001 at [redacted]w[redacted]d being seen today for ongoing prenatal care.  She is currently monitored for the following issues for this low-risk pregnancy and has Encounter for supervision of normal pregnancy, antepartum; Atypical squamous cells cannot exclude high grade squamous intraepithelial lesion on cytologic smear of cervix (ASC-H); and IUGR (intrauterine growth restriction) affecting care of mother on their problem list.  Patient reports no complaints.  Contractions: Not present. Vag. Bleeding: None.  Movement: Present. Denies leaking of fluid.   The following portions of the patient's history were reviewed and updated as appropriate: allergies, current medications, past family history, past medical history, past social history, past surgical history and problem list.   Objective:   Vitals:   02/25/19 1450  BP: 107/69  Pulse: 85  Weight: 139 lb (63 kg)    Fetal Status: Fetal Heart Rate (bpm): 140   Movement: Present     General:  Alert, oriented and cooperative. Patient is in no acute distress.  Skin: Skin is warm and dry. No rash noted.   Cardiovascular: Normal heart rate noted  Respiratory: Normal respiratory effort, no problems with respiration noted  Abdomen: Soft, gravid, appropriate for gestational age. Pain/Pressure: Absent     Pelvic:  Cervical exam deferred        Extremities: Normal range of motion.  Edema: None  Mental Status: Normal mood and affect. Normal behavior. Normal judgment and thought content.   Urinalysis:      Assessment and Plan:  Pregnancy: G2P1001 at [redacted]w[redacted]d  1. Supervision of other normal pregnancy, antepartum - will repeat UA as first specimen was likely contaminated  - RTC in 4 weeks  - Colpo post-partum  - f/u growth scan   2. Intrauterine growth restriction (IUGR) affecting care of mother, second trimester, single or unspecified fetus - patient reports everyone in her family is short - f/u US for growth  on 9/2 - FOB with height of 6'11''  Preterm labor symptoms and general obstetric precautions including but not limited to vaginal bleeding, contractions, leaking of fluid and fetal movement were reviewed in detail with the patient. Please refer to After Visit Summary for other counseling recommendations.  No follow-ups on file.   Chauncey Mann, MD

## 2019-02-25 NOTE — Patient Instructions (Signed)
Intrauterine Growth Restriction ° °Intrauterine growth restriction (IUGR) is when a baby is not growing normally during pregnancy. A baby with IUGR is smaller than it should be and may weigh less than normal at birth. °IUGR can result from a problem with the organ that supplies the unborn baby (fetus) with oxygen and nutrition (placenta). Usually, there is no way to prevent this type of problem. Babies with IUGR are at higher risk for early delivery and needing special (intensive) care after birth. °What are the causes? °The most common cause of IUGR is a problem with the placenta or umbilical cord that causes the fetus to get less oxygen or nutrition than needed. Other causes include: °· The mother eating a very unhealthy diet (poor maternal nutrition). °· Exposure to chemicals found in substances such as cigarettes, alcohol, and some drugs. °· Some prescription medicines. °· Other problems that develop in the womb (congenital birth defects). °· Genetic disorders. °· Infection. °· Carrying more than one baby. °What increases the risk? °This condition is more likely to affect babies of mothers who: °· Are over the age of 35 at the time of delivery. °· Are younger than age 16 at the time of delivery. °· Have medical conditions such as high blood pressure, preeclampsia, diabetes, heart or kidney disease, systemic lupus erythematosus, or anemia. °· Live at a very high altitude during pregnancy. °· Have a personal history or family history of: °? IUGR. °? A genetic disorder. °· Take medicines during pregnancy that are related to congenital disabilities. °· Come into contact with infected cat feces (toxoplasmosis). °· Come into contact with chickenpox (varicella) or German measles (rubella). °· Have or are at risk of getting an infectious disease such as syphilis, HIV, or herpes. °· Eat an unhealthy diet during pregnancy. °· Weigh less than 100 pounds. °· Have had treatments to help her have children (infertility  treatments). °· Use tobacco, drugs, or alcohol during pregnancy. °What are the signs or symptoms? °IUGR does not cause many symptoms. You might notice that your baby does not move or kick very often. Also, your belly may not be as big as expected for the stage of your pregnancy. °How is this diagnosed? °This condition is diagnosed with physical exams and prenatal exams. You may also have: °· Fundal measurements to check the size of your uterus. °· An ultrasound done to measure your baby's size compared to the size of other babies at the same stage of development (gestational age). Your health care provider will monitor your baby's growth with ultrasounds throughout pregnancy. °You may also have tests to find the cause of IUGR. These may include: °· Amniocentesis. This is a procedure that involves passing a needle into the uterus to collect a sample of fluid that surrounds the fetus (amniotic fluid). This may be done to check for signs of infection or congenital defects. °· Tests to evaluate blood flow to your baby and placenta. °How is this treated? °In most cases, the goal of treatment is to treat the cause of IUGR. Your health care providers will monitor your pregnancy closely and help you manage your pregnancy. °If your condition is caused by a placenta problem and your baby is not getting enough blood, you may need: °· Medicine to start labor and deliver your baby early (induction). °· Cesarean delivery, also called a C-section. In this procedure, your baby is delivered through an incision in your abdomen and uterus. °Follow these instructions at home: °Medicines °· Take over-the-counter and prescription medicines   only as told by your health care provider. This includes vitamins and supplements. °· Make sure that your health care provider knows about and approves of all the medicines, supplements, vitamins, eye drops, and creams that you use. °General instructions °· Eat a healthy diet that includes fresh fruits  and vegetables, lean proteins, whole grains, and calcium-rich foods such as milk, yogurt, and dark, leafy greens. Work with your health care provider or a dietitian to make sure that: °? You are getting enough nutrients. °? You are gaining enough weight. °· Rest as needed. Try to get at least 8 hours of sleep every night. °· Do not drink alcohol or use drugs. °· Do not use any products that contain nicotine or tobacco, such as cigarettes and e-cigarettes. If you need help quitting, ask your health care provider. °· Keep all follow-up visits as told by your health care provider. This is important. °Get help right away if you: °· Notice that your baby is moving less than usual or is not moving. °· Have contractions that are 5 minutes or less apart, or that increase in frequency, intensity, or length. °· Have signs and symptoms of infection, including a fever. °· Have vaginal bleeding. °· Have increased swelling in your legs, hands, or face. °· Have vision changes, including seeing spots or having blurry or double vision. °· Have a severe headache that does not go away. °· Have sudden, sharp abdominal pain or low back pain. °· Have an uncontrolled gush or trickle of fluid from your vagina. °Summary °· Intrauterine growth restriction (IUGR) is when a baby is not growing normally during pregnancy. °· The most common cause of IUGR is a problem with the placenta or umbilical cord that causes the fetus to get less oxygen or nutrition than needed. °· This condition is diagnosed with physical and prenatal exams. Your health care provider will monitor your baby's growth with ultrasounds throughout pregnancy. °· Make sure that your health care provider knows about and approves of all the medicines, supplements, vitamins, eye drops, and creams that you use. °This information is not intended to replace advice given to you by your health care provider. Make sure you discuss any questions you have with your health care  provider. °Document Released: 03/27/2008 Document Revised: 05/31/2017 Document Reviewed: 04/18/2017 °[employer] Patient Education © 2020 [employer] ° °

## 2019-02-27 LAB — URINE CULTURE, OB REFLEX

## 2019-02-27 LAB — CULTURE, OB URINE

## 2019-03-04 ENCOUNTER — Ambulatory Visit (HOSPITAL_COMMUNITY)
Admission: RE | Admit: 2019-03-04 | Discharge: 2019-03-04 | Disposition: A | Discharge status: Home / Self Care | Payer: Medicaid Other | Source: Ambulatory Visit | Attending: Obstetrics and Gynecology | Admitting: Obstetrics and Gynecology

## 2019-03-04 ENCOUNTER — Other Ambulatory Visit (HOSPITAL_COMMUNITY): Payer: Self-pay | Admitting: Maternal & Fetal Medicine

## 2019-03-04 ENCOUNTER — Ambulatory Visit (HOSPITAL_COMMUNITY): Payer: Medicaid Other | Admitting: *Deleted

## 2019-03-04 ENCOUNTER — Encounter (HOSPITAL_COMMUNITY): Payer: Self-pay

## 2019-03-04 ENCOUNTER — Other Ambulatory Visit: Payer: Self-pay

## 2019-03-04 ENCOUNTER — Other Ambulatory Visit (HOSPITAL_COMMUNITY): Payer: Self-pay | Admitting: *Deleted

## 2019-03-04 DIAGNOSIS — O365920 Maternal care for other known or suspected poor fetal growth, second trimester, not applicable or unspecified: Secondary | ICD-10-CM | POA: Insufficient documentation

## 2019-03-04 DIAGNOSIS — Z362 Encounter for other antenatal screening follow-up: Secondary | ICD-10-CM

## 2019-03-04 DIAGNOSIS — Z3A23 23 weeks gestation of pregnancy: Secondary | ICD-10-CM

## 2019-03-04 DIAGNOSIS — O359XX0 Maternal care for (suspected) fetal abnormality and damage, unspecified, not applicable or unspecified: Secondary | ICD-10-CM | POA: Diagnosis not present

## 2019-03-25 ENCOUNTER — Ambulatory Visit (HOSPITAL_COMMUNITY): Payer: Medicaid Other | Admitting: *Deleted

## 2019-03-25 ENCOUNTER — Other Ambulatory Visit: Payer: Self-pay

## 2019-03-25 ENCOUNTER — Other Ambulatory Visit (HOSPITAL_COMMUNITY): Payer: Self-pay | Admitting: Obstetrics

## 2019-03-25 ENCOUNTER — Ambulatory Visit (HOSPITAL_COMMUNITY)
Admission: RE | Admit: 2019-03-25 | Discharge: 2019-03-25 | Disposition: A | Discharge status: Home / Self Care | Payer: Medicaid Other | Source: Ambulatory Visit | Attending: Obstetrics and Gynecology | Admitting: Obstetrics and Gynecology

## 2019-03-25 ENCOUNTER — Encounter (HOSPITAL_COMMUNITY): Payer: Self-pay

## 2019-03-25 ENCOUNTER — Encounter: Payer: Medicaid Other | Admitting: Advanced Practice Midwife

## 2019-03-25 DIAGNOSIS — Z3A26 26 weeks gestation of pregnancy: Secondary | ICD-10-CM

## 2019-03-25 DIAGNOSIS — Z362 Encounter for other antenatal screening follow-up: Secondary | ICD-10-CM

## 2019-03-25 DIAGNOSIS — O365920 Maternal care for other known or suspected poor fetal growth, second trimester, not applicable or unspecified: Secondary | ICD-10-CM

## 2019-03-25 DIAGNOSIS — O359XX0 Maternal care for (suspected) fetal abnormality and damage, unspecified, not applicable or unspecified: Secondary | ICD-10-CM

## 2019-03-26 ENCOUNTER — Other Ambulatory Visit (HOSPITAL_COMMUNITY): Payer: Self-pay | Admitting: *Deleted

## 2019-03-26 DIAGNOSIS — O365990 Maternal care for other known or suspected poor fetal growth, unspecified trimester, not applicable or unspecified: Secondary | ICD-10-CM

## 2019-04-07 ENCOUNTER — Encounter: Payer: Self-pay | Admitting: Obstetrics and Gynecology

## 2019-04-07 ENCOUNTER — Other Ambulatory Visit: Payer: Medicaid Other

## 2019-04-07 ENCOUNTER — Other Ambulatory Visit: Payer: Self-pay

## 2019-04-07 ENCOUNTER — Ambulatory Visit (INDEPENDENT_AMBULATORY_CARE_PROVIDER_SITE_OTHER): Payer: Medicaid Other | Admitting: Obstetrics and Gynecology

## 2019-04-07 VITALS — BP 114/77 | HR 79 | Temp 96.7°F | Wt 146.2 lb

## 2019-04-07 DIAGNOSIS — Z23 Encounter for immunization: Secondary | ICD-10-CM

## 2019-04-07 DIAGNOSIS — Z3A28 28 weeks gestation of pregnancy: Secondary | ICD-10-CM

## 2019-04-07 DIAGNOSIS — O365920 Maternal care for other known or suspected poor fetal growth, second trimester, not applicable or unspecified: Secondary | ICD-10-CM

## 2019-04-07 DIAGNOSIS — Z3480 Encounter for supervision of other normal pregnancy, unspecified trimester: Secondary | ICD-10-CM

## 2019-04-07 NOTE — Patient Instructions (Signed)
Intrauterine Growth Restriction  Intrauterine growth restriction (IUGR) is when a baby is not growing normally during pregnancy. A baby with IUGR is smaller than it should be and may weigh less than normal at birth. IUGR can result from a problem with the organ that supplies the unborn baby (fetus) with oxygen and nutrition (placenta). Usually, there is no way to prevent this type of problem. Babies with IUGR are at higher risk for early delivery and needing special (intensive) care after birth. What are the causes? The most common cause of IUGR is a problem with the placenta or umbilical cord that causes the fetus to get less oxygen or nutrition than needed. Other causes include:  The mother eating a very unhealthy diet (poor maternal nutrition).  Exposure to chemicals found in substances such as cigarettes, alcohol, and some drugs.  Some prescription medicines.  Other problems that develop in the womb (congenital birth defects).  Genetic disorders.  Infection.  Carrying more than one baby. What increases the risk? This condition is more likely to affect babies of mothers who:  Are over the age of 35 at the time of delivery.  Are younger than age 16 at the time of delivery.  Have medical conditions such as high blood pressure, preeclampsia, diabetes, heart or kidney disease, systemic lupus erythematosus, or anemia.  Live at a very high altitude during pregnancy.  Have a personal history or family history of: ? IUGR. ? A genetic disorder.  Take medicines during pregnancy that are related to congenital disabilities.  Come into contact with infected cat feces (toxoplasmosis).  Come into contact with chickenpox (varicella) or German measles (rubella).  Have or are at risk of getting an infectious disease such as syphilis, HIV, or herpes.  Eat an unhealthy diet during pregnancy.  Weigh less than 100 pounds.  Have had treatments to help her have children (infertility  treatments).  Use tobacco, drugs, or alcohol during pregnancy. What are the signs or symptoms? IUGR does not cause many symptoms. You might notice that your baby does not move or kick very often. Also, your belly may not be as big as expected for the stage of your pregnancy. How is this diagnosed? This condition is diagnosed with physical exams and prenatal exams. You may also have:  Fundal measurements to check the size of your uterus.  An ultrasound done to measure your baby's size compared to the size of other babies at the same stage of development (gestational age). Your health care provider will monitor your baby's growth with ultrasounds throughout pregnancy. You may also have tests to find the cause of IUGR. These may include:  Amniocentesis. This is a procedure that involves passing a needle into the uterus to collect a sample of fluid that surrounds the fetus (amniotic fluid). This may be done to check for signs of infection or congenital defects.  Tests to evaluate blood flow to your baby and placenta. How is this treated? In most cases, the goal of treatment is to treat the cause of IUGR. Your health care providers will monitor your pregnancy closely and help you manage your pregnancy. If your condition is caused by a placenta problem and your baby is not getting enough blood, you may need:  Medicine to start labor and deliver your baby early (induction).  Cesarean delivery, also called a C-section. In this procedure, your baby is delivered through an incision in your abdomen and uterus. Follow these instructions at home: Medicines  Take over-the-counter and prescription medicines   only as told by your health care provider. This includes vitamins and supplements.  Make sure that your health care provider knows about and approves of all the medicines, supplements, vitamins, eye drops, and creams that you use. General instructions  Eat a healthy diet that includes fresh fruits  and vegetables, lean proteins, whole grains, and calcium-rich foods such as milk, yogurt, and dark, leafy greens. Work with your health care provider or a dietitian to make sure that: ? You are getting enough nutrients. ? You are gaining enough weight.  Rest as needed. Try to get at least 8 hours of sleep every night.  Do not drink alcohol or use drugs.  Do not use any products that contain nicotine or tobacco, such as cigarettes and e-cigarettes. If you need help quitting, ask your health care provider.  Keep all follow-up visits as told by your health care provider. This is important. Get help right away if you:  Notice that your baby is moving less than usual or is not moving.  Have contractions that are 5 minutes or less apart, or that increase in frequency, intensity, or length.  Have signs and symptoms of infection, including a fever.  Have vaginal bleeding.  Have increased swelling in your legs, hands, or face.  Have vision changes, including seeing spots or having blurry or double vision.  Have a severe headache that does not go away.  Have sudden, sharp abdominal pain or low back pain.  Have an uncontrolled gush or trickle of fluid from your vagina. Summary  Intrauterine growth restriction (IUGR) is when a baby is not growing normally during pregnancy.  The most common cause of IUGR is a problem with the placenta or umbilical cord that causes the fetus to get less oxygen or nutrition than needed.  This condition is diagnosed with physical and prenatal exams. Your health care provider will monitor your baby's growth with ultrasounds throughout pregnancy.  Make sure that your health care provider knows about and approves of all the medicines, supplements, vitamins, eye drops, and creams that you use. This information is not intended to replace advice given to you by your health care provider. Make sure you discuss any questions you have with your health care provider.  Document Released: 03/27/2008 Document Revised: 05/31/2017 Document Reviewed: 04/18/2017 Elsevier Patient Education  2020 [employer]. Third Trimester of Pregnancy The third trimester is from week 28 through week 40 (months 7 through 9). The third trimester is a time when the unborn baby (fetus) is growing rapidly. At the end of the ninth month, the fetus is about 20 inches in length and weighs 6-10 pounds. Body changes during your third trimester Your body will continue to go through many changes during pregnancy. The changes vary from woman to woman. During the third trimester:  Your weight will continue to increase. You can expect to gain 25-35 pounds (11-16 kg) by the end of the pregnancy.  You may begin to get stretch marks on your hips, abdomen, and breasts.  You may urinate more often because the fetus is moving lower into your pelvis and pressing on your bladder.  You may develop or continue to have heartburn. This is caused by increased hormones that slow down muscles in the digestive tract.  You may develop or continue to have constipation because increased hormones slow digestion and cause the muscles that push waste through your intestines to relax.  You may develop hemorrhoids. These are swollen veins (varicose veins) in the rectum that  can itch or be painful.  You may develop swollen, bulging veins (varicose veins) in your legs.  You may have increased body aches in the pelvis, back, or thighs. This is due to weight gain and increased hormones that are relaxing your joints.  You may have changes in your hair. These can include thickening of your hair, rapid growth, and changes in texture. Some women also have hair loss during or after pregnancy, or hair that feels dry or thin. Your hair will most likely return to normal after your baby is born.  Your breasts will continue to grow and they will continue to become tender. A yellow fluid (colostrum) may leak from your breasts.  This is the first milk you are producing for your baby.  Your belly button may stick out.  You may notice more swelling in your hands, face, or ankles.  You may have increased tingling or numbness in your hands, arms, and legs. The skin on your belly may also feel numb.  You may feel short of breath because of your expanding uterus.  You may have more problems sleeping. This can be caused by the size of your belly, increased need to urinate, and an increase in your body's metabolism.  You may notice the fetus "dropping," or moving lower in your abdomen (lightening).  You may have increased vaginal discharge.  You may notice your joints feel loose and you may have pain around your pelvic bone. What to expect at prenatal visits You will have prenatal exams every 2 weeks until week 36. Then you will have weekly prenatal exams. During a routine prenatal visit:  You will be weighed to make sure you and the baby are growing normally.  Your blood pressure will be taken.  Your abdomen will be measured to track your baby's growth.  The fetal heartbeat will be listened to.  Any test results from the previous visit will be discussed.  You may have a cervical check near your due date to see if your cervix has softened or thinned (effaced).  You will be tested for Group B streptococcus. This happens between 35 and 37 weeks. Your health care provider may ask you:  What your birth plan is.  How you are feeling.  If you are feeling the baby move.  If you have had any abnormal symptoms, such as leaking fluid, bleeding, severe headaches, or abdominal cramping.  If you are using any tobacco products, including cigarettes, chewing tobacco, and electronic cigarettes.  If you have any questions. Other tests or screenings that may be performed during your third trimester include:  Blood tests that check for low iron levels (anemia).  Fetal testing to check the health, activity level, and  growth of the fetus. Testing is done if you have certain medical conditions or if there are problems during the pregnancy.  Nonstress test (NST). This test checks the health of your baby to make sure there are no signs of problems, such as the baby not getting enough oxygen. During this test, a belt is placed around your belly. The baby is made to move, and its heart rate is monitored during movement. What is false labor? False labor is a condition in which you feel small, irregular tightenings of the muscles in the womb (contractions) that usually go away with rest, changing position, or drinking water. These are called Braxton Hicks contractions. Contractions may last for hours, days, or even weeks before true labor sets in. If contractions come at regular  intervals, become more frequent, increase in intensity, or become painful, you should see your health care provider. What are the signs of labor?  Abdominal cramps.  Regular contractions that start at 10 minutes apart and become stronger and more frequent with time.  Contractions that start on the top of the uterus and spread down to the lower abdomen and back.  Increased pelvic pressure and dull back pain.  A watery or bloody mucus discharge that comes from the vagina.  Leaking of amniotic fluid. This is also known as your "water breaking." It could be a slow trickle or a gush. Let your health care provider know if it has a color or strange odor. If you have any of these signs, call your health care provider right away, even if it is before your due date. Follow these instructions at home: Medicines  Follow your health care provider's instructions regarding medicine use. Specific medicines may be either safe or unsafe to take during pregnancy.  Take a prenatal vitamin that contains at least 600 micrograms (mcg) of folic acid.  If you develop constipation, try taking a stool softener if your health care provider approves. Eating and  drinking   Eat a balanced diet that includes fresh fruits and vegetables, whole grains, good sources of protein such as meat, eggs, or tofu, and low-fat dairy. Your health care provider will help you determine the amount of weight gain that is right for you.  Avoid raw meat and uncooked cheese. These carry germs that can cause birth defects in the baby.  If you have low calcium intake from food, talk to your health care provider about whether you should take a daily calcium supplement.  Eat four or five small meals rather than three large meals a day.  Limit foods that are high in fat and processed sugars, such as fried and sweet foods.  To prevent constipation: ? Drink enough fluid to keep your urine clear or pale yellow. ? Eat foods that are high in fiber, such as fresh fruits and vegetables, whole grains, and beans. Activity  Exercise only as directed by your health care provider. Most women can continue their usual exercise routine during pregnancy. Try to exercise for 30 minutes at least 5 days a week. Stop exercising if you experience uterine contractions.  Avoid heavy lifting.  Do not exercise in extreme heat or humidity, or at high altitudes.  Wear low-heel, comfortable shoes.  Practice good posture.  You may continue to have sex unless your health care provider tells you otherwise. Relieving pain and discomfort  Take frequent breaks and rest with your legs elevated if you have leg cramps or low back pain.  Take warm sitz baths to soothe any pain or discomfort caused by hemorrhoids. Use hemorrhoid cream if your health care provider approves.  Wear a good support bra to prevent discomfort from breast tenderness.  If you develop varicose veins: ? Wear support pantyhose or compression stockings as told by your healthcare provider. ? Elevate your feet for 15 minutes, 3-4 times a day. Prenatal care  Write down your questions. Take them to your prenatal visits.  Keep all  your prenatal visits as told by your health care provider. This is important. Safety  Wear your seat belt at all times when driving.  Make a list of emergency phone numbers, including numbers for family, friends, the hospital, and police and fire departments. General instructions  Avoid cat litter boxes and soil used by cats. These carry germs  that can cause birth defects in the baby. If you have a cat, ask someone to clean the litter box for you.  Do not travel far distances unless it is absolutely necessary and only with the approval of your health care provider.  Do not use hot tubs, steam rooms, or saunas.  Do not drink alcohol.  Do not use any products that contain nicotine or tobacco, such as cigarettes and e-cigarettes. If you need help quitting, ask your health care provider.  Do not use any medicinal herbs or unprescribed drugs. These chemicals affect the formation and growth of the baby.  Do not douche or use tampons or scented sanitary pads.  Do not cross your legs for long periods of time.  To prepare for the arrival of your baby: ? Take prenatal classes to understand, practice, and ask questions about labor and delivery. ? Make a trial run to the hospital. ? Visit the hospital and tour the maternity area. ? Arrange for maternity or paternity leave through employers. ? Arrange for family and friends to take care of pets while you are in the hospital. ? Purchase a rear-facing car seat and make sure you know how to install it in your car. ? Pack your hospital bag. ? Prepare the baby's nursery. Make sure to remove all pillows and stuffed animals from the baby's crib to prevent suffocation.  Visit your dentist if you have not gone during your pregnancy. Use a soft toothbrush to brush your teeth and be gentle when you floss. Contact a health care provider if:  You are unsure if you are in labor or if your water has broken.  You become dizzy.  You have mild pelvic  cramps, pelvic pressure, or nagging pain in your abdominal area.  You have lower back pain.  You have persistent nausea, vomiting, or diarrhea.  You have an unusual or bad smelling vaginal discharge.  You have pain when you urinate. Get help right away if:  Your water breaks before 37 weeks.  You have regular contractions less than 5 minutes apart before 37 weeks.  You have a fever.  You are leaking fluid from your vagina.  You have spotting or bleeding from your vagina.  You have severe abdominal pain or cramping.  You have rapid weight loss or weight gain.  You have shortness of breath with chest pain.  You notice sudden or extreme swelling of your face, hands, ankles, feet, or legs.  Your baby makes fewer than 10 movements in 2 hours.  You have severe headaches that do not go away when you take medicine.  You have vision changes. Summary  The third trimester is from week 28 through week 40, months 7 through 9. The third trimester is a time when the unborn baby (fetus) is growing rapidly.  During the third trimester, your discomfort may increase as you and your baby continue to gain weight. You may have abdominal, leg, and back pain, sleeping problems, and an increased need to urinate.  During the third trimester your breasts will keep growing and they will continue to become tender. A yellow fluid (colostrum) may leak from your breasts. This is the first milk you are producing for your baby.  False labor is a condition in which you feel small, irregular tightenings of the muscles in the womb (contractions) that eventually go away. These are called Braxton Hicks contractions. Contractions may last for hours, days, or even weeks before true labor sets in.  Signs of  labor can include: abdominal cramps; regular contractions that start at 10 minutes apart and become stronger and more frequent with time; watery or bloody mucus discharge that comes from the vagina; increased  pelvic pressure and dull back pain; and leaking of amniotic fluid. This information is not intended to replace advice given to you by your health care provider. Make sure you discuss any questions you have with your health care provider. Document Released: 06/12/2001 Document Revised: 10/09/2018 Document Reviewed: 07/24/2016 Elsevier Patient Education  2020 [employer].

## 2019-04-07 NOTE — Progress Notes (Signed)
ROB presents for 2 gtt labs, flu and tdap vaccines today Pt has questions about her delivery; she was informed she couldn't deliver her baby in [place].

## 2019-04-07 NOTE — Progress Notes (Signed)
Subjective:  Tanya Rodgers is a 23 y.o. G2P1001 at [redacted]w[redacted]d being seen today for ongoing prenatal care.  She is currently monitored for the following issues for this high-risk pregnancy and has Encounter for supervision of normal pregnancy, antepartum; Atypical squamous cells cannot exclude high grade squamous intraepithelial lesion on cytologic smear of cervix (ASC-H); and IUGR (intrauterine growth restriction) affecting care of mother on their problem list.  Patient reports no complaints.  Contractions: Not present. Vag. Bleeding: None.  Movement: Present. Denies leaking of fluid.   The following portions of the patient's history were reviewed and updated as appropriate: allergies, current medications, past family history, past medical history, past social history, past surgical history and problem list. Problem list updated.  Objective:   Vitals:   04/07/19 0903  BP: 114/77  Pulse: 79  Temp: (!) 96.7 F (35.9 C)  Weight: 146 lb 3.2 oz (66.3 kg)    Fetal Status:     Movement: Present     General:  Alert, oriented and cooperative. Patient is in no acute distress.  Skin: Skin is warm and dry. No rash noted.   Cardiovascular: Normal heart rate noted  Respiratory: Normal respiratory effort, no problems with respiration noted  Abdomen: Soft, gravid, appropriate for gestational age. Pain/Pressure: Absent     Pelvic:  Cervical exam deferred        Extremities: Normal range of motion.  Edema: Trace  Mental Status: Normal mood and affect. Normal behavior. Normal judgment and thought content.   Urinalysis:      Assessment and Plan:  Pregnancy: G2P1001 at [redacted]w[redacted]d  1. Supervision of other normal pregnancy, antepartum Stable Flu vaccine and Tdap today - Glucose Tolerance, 2 Hours w/1 Hour - CBC - HIV antibody (with reflex) - RPR  2. Poor fetal growth affecting management of mother in second trimester, single or unspecified fetus IUGR reviewed with pt. Potential for BMZ and early  delivery discussed with pt Continue with growth scans  And doppler studies as per MFM  Preterm labor symptoms and general obstetric precautions including but not limited to vaginal bleeding, contractions, leaking of fluid and fetal movement were reviewed in detail with the patient. Please refer to After Visit Summary for other counseling recommendations.  Return in about 2 weeks (around 04/21/2019) for OB visit, face to face.   Chancy Milroy, MD

## 2019-04-08 ENCOUNTER — Ambulatory Visit (HOSPITAL_COMMUNITY)
Admission: RE | Admit: 2019-04-08 | Discharge: 2019-04-08 | Disposition: A | Discharge status: Home / Self Care | Payer: Medicaid Other | Source: Ambulatory Visit | Attending: Obstetrics | Admitting: Obstetrics

## 2019-04-08 ENCOUNTER — Encounter (HOSPITAL_COMMUNITY): Payer: Self-pay

## 2019-04-08 ENCOUNTER — Ambulatory Visit (HOSPITAL_COMMUNITY): Payer: Medicaid Other | Admitting: *Deleted

## 2019-04-08 DIAGNOSIS — O365920 Maternal care for other known or suspected poor fetal growth, second trimester, not applicable or unspecified: Secondary | ICD-10-CM | POA: Diagnosis present

## 2019-04-08 DIAGNOSIS — O365990 Maternal care for other known or suspected poor fetal growth, unspecified trimester, not applicable or unspecified: Secondary | ICD-10-CM | POA: Insufficient documentation

## 2019-04-08 DIAGNOSIS — O359XX0 Maternal care for (suspected) fetal abnormality and damage, unspecified, not applicable or unspecified: Secondary | ICD-10-CM | POA: Diagnosis not present

## 2019-04-08 DIAGNOSIS — Z3A28 28 weeks gestation of pregnancy: Secondary | ICD-10-CM

## 2019-04-08 DIAGNOSIS — O365930 Maternal care for other known or suspected poor fetal growth, third trimester, not applicable or unspecified: Secondary | ICD-10-CM | POA: Diagnosis not present

## 2019-04-08 LAB — GLUCOSE TOLERANCE, 2 HOURS W/ 1HR
Glucose, 1 hour: 107 mg/dL (ref 65–179)
Glucose, 2 hour: 93 mg/dL (ref 65–152)
Glucose, Fasting: 78 mg/dL (ref 65–91)

## 2019-04-08 LAB — CBC
Hematocrit: 34.3 % (ref 34.0–46.6)
Hemoglobin: 11.1 g/dL (ref 11.1–15.9)
MCH: 29.2 pg (ref 26.6–33.0)
MCHC: 32.4 g/dL (ref 31.5–35.7)
MCV: 90 fL (ref 79–97)
Platelets: 312 10*3/uL (ref 150–450)
RBC: 3.8 x10E6/uL (ref 3.77–5.28)
RDW: 12.7 % (ref 11.7–15.4)
WBC: 13.9 10*3/uL — ABNORMAL HIGH (ref 3.4–10.8)

## 2019-04-08 LAB — RPR: RPR Ser Ql: NONREACTIVE

## 2019-04-08 LAB — HIV ANTIBODY (ROUTINE TESTING W REFLEX): HIV Screen 4th Generation wRfx: NONREACTIVE

## 2019-04-09 ENCOUNTER — Other Ambulatory Visit (HOSPITAL_COMMUNITY): Payer: Self-pay | Admitting: *Deleted

## 2019-04-09 DIAGNOSIS — O365990 Maternal care for other known or suspected poor fetal growth, unspecified trimester, not applicable or unspecified: Secondary | ICD-10-CM

## 2019-04-15 ENCOUNTER — Encounter (HOSPITAL_COMMUNITY): Payer: Self-pay

## 2019-04-15 ENCOUNTER — Ambulatory Visit (HOSPITAL_COMMUNITY): Payer: Medicaid Other | Admitting: *Deleted

## 2019-04-15 ENCOUNTER — Encounter (HOSPITAL_COMMUNITY): Payer: Self-pay | Admitting: *Deleted

## 2019-04-15 ENCOUNTER — Ambulatory Visit (HOSPITAL_COMMUNITY)
Admission: RE | Admit: 2019-04-15 | Discharge: 2019-04-15 | Disposition: A | Discharge status: Home / Self Care | Payer: Medicaid Other | Source: Ambulatory Visit | Attending: Obstetrics and Gynecology | Admitting: Obstetrics and Gynecology

## 2019-04-15 ENCOUNTER — Other Ambulatory Visit: Payer: Self-pay

## 2019-04-15 DIAGNOSIS — O365990 Maternal care for other known or suspected poor fetal growth, unspecified trimester, not applicable or unspecified: Secondary | ICD-10-CM

## 2019-04-15 DIAGNOSIS — Z3A29 29 weeks gestation of pregnancy: Secondary | ICD-10-CM | POA: Diagnosis not present

## 2019-04-15 DIAGNOSIS — O365930 Maternal care for other known or suspected poor fetal growth, third trimester, not applicable or unspecified: Secondary | ICD-10-CM

## 2019-04-15 DIAGNOSIS — O365920 Maternal care for other known or suspected poor fetal growth, second trimester, not applicable or unspecified: Secondary | ICD-10-CM | POA: Insufficient documentation

## 2019-04-15 DIAGNOSIS — O359XX0 Maternal care for (suspected) fetal abnormality and damage, unspecified, not applicable or unspecified: Secondary | ICD-10-CM | POA: Diagnosis not present

## 2019-04-15 NOTE — Procedures (Signed)
Tanya Rodgers 08-Jul-1995 [redacted]w[redacted]d  Fetus A Non-Stress Test Interpretation for 04/15/19  Indication: IUGR  Fetal Heart Rate A Mode: External Baseline Rate (A): 125 bpm Variability: Moderate Accelerations: 10 x 10 Decelerations: None Multiple birth?: No  Uterine Activity Mode: Palpation, Toco Contraction Frequency (min): Erratic UI Contraction Quality: Mild Resting Tone Palpated: Relaxed Resting Time: Adequate  Interpretation (Fetal Testing) Nonstress Test Interpretation: Reactive Overall Impression: Reassuring for gestational age Comments: EFM tracing reviewed by Dr. Donalee Citrin

## 2019-04-16 ENCOUNTER — Encounter (HOSPITAL_COMMUNITY): Payer: Self-pay | Admitting: Obstetrics and Gynecology

## 2019-04-16 ENCOUNTER — Inpatient Hospital Stay (HOSPITAL_COMMUNITY)
Admission: AD | Admit: 2019-04-16 | Discharge: 2019-04-16 | Disposition: A | Discharge status: Home / Self Care | Payer: Medicaid Other | Attending: Obstetrics and Gynecology | Admitting: Obstetrics and Gynecology

## 2019-04-16 DIAGNOSIS — N949 Unspecified condition associated with female genital organs and menstrual cycle: Secondary | ICD-10-CM | POA: Diagnosis present

## 2019-04-16 DIAGNOSIS — Z3A29 29 weeks gestation of pregnancy: Secondary | ICD-10-CM | POA: Diagnosis not present

## 2019-04-16 DIAGNOSIS — R102 Pelvic and perineal pain: Secondary | ICD-10-CM | POA: Insufficient documentation

## 2019-04-16 DIAGNOSIS — O26893 Other specified pregnancy related conditions, third trimester: Secondary | ICD-10-CM | POA: Diagnosis present

## 2019-04-16 LAB — URINALYSIS, ROUTINE W REFLEX MICROSCOPIC
Bilirubin Urine: NEGATIVE
Glucose, UA: NEGATIVE mg/dL
Hgb urine dipstick: NEGATIVE
Ketones, ur: NEGATIVE mg/dL
Nitrite: NEGATIVE
Protein, ur: NEGATIVE mg/dL
Specific Gravity, Urine: 1.019 (ref 1.005–1.030)
pH: 6 (ref 5.0–8.0)

## 2019-04-16 MED ORDER — CYCLOBENZAPRINE HCL 10 MG PO TABS: 10.0000 mg | ORAL_TABLET | Freq: Two times a day (BID) | ORAL | 0 refills | Status: DC | PRN

## 2019-04-16 MED ORDER — CYCLOBENZAPRINE HCL 10 MG PO TABS
10.0000 mg | ORAL_TABLET | Freq: Once | ORAL | Status: AC
  Administered 2019-04-16: 10 mg via ORAL
  Filled 2019-04-16: qty 1

## 2019-04-16 MED ORDER — COMFORT FIT MATERNITY SUPP SM MISC: 1.0000 [IU] | Freq: Every day | 0 refills | Status: DC | PRN

## 2019-04-16 NOTE — [facility] Note (Signed)
.   Tanya Rodgers is a 23 y.o. at [redacted]w[redacted]d here in [facility] reporting: a decrease in fetal movement since last night. Has periods of stabbing pain in her right lower at times.   Onset of complaint: last night Pain score: 8 Vitals:   04/16/19 1312  BP: 110/69  Pulse: (!) 101  Resp: 16  Temp: 98.1 F (36.7 C)  SpO2: 100%     FHT:133 Lab orders placed from triage: [facility]

## 2019-04-16 NOTE — Progress Notes (Signed)
Pt care assumed by this RN.

## 2019-04-16 NOTE — Discharge Instructions (Signed)
You can pick up a maternity support belt at: °[facility]  °[address] [place], [place] [address]  °[phone]  °[email or web address] ° °

## 2019-04-16 NOTE — MAU Provider Note (Signed)
History     CSN: [id number]  Arrival date and time: 04/16/19 1233   First Provider Initiated Contact with Patient 04/16/19 1333      Chief Complaint  Patient presents with  . Decreased Fetal Movement  . Abdominal Pain   HPI  Ms.  Tanya Rodgers is a 23 y.o. year old G62P1001 female at 62w4dweeks gestation who presents to MAU reporting DFM since last night and an occasional stabbing in RLQ rating a 8/10. She denies taking any medication for the pain. She denies any contractions, VB or LOF. She reports (+) good FM. [facility]   Past Medical History:  Diagnosis Date  . GBS bacteriuria 02/23/2016   Will need prophylaxis in labor  . Medical history non-contributory   . Supervision of normal first pregnancy, antepartum 02/14/2016    Clinic  [facility] Prenatal Labs Dating  7 week sono Blood type: O/Positive/-- (08/15 1531) O pos Genetic Screen 1 Screen: nl NT   AFP: neg     Antibody:Negative (08/15 1531)neg Anatomic [place] normal  Rubella: 1.96 (08/15 1531)Immune GTT  Third trimester: nl 2hr RPR: Non Reactive (08/15 1531) NR Flu vaccine  declined HBsAg: Negative (08/15 1531) NEg TDaP vaccine                                           Past Surgical History:  Procedure Laterality Date  . TONSILLECTOMY      History reviewed. No pertinent family history.  Social History   Tobacco Use  . Smoking status: Never Smoker  . Smokeless tobacco: Never Used  Substance Use Topics  . Alcohol use: No  . Drug use: No    Allergies:  Allergies  Allergen Reactions  . Kiwi Extract Swelling  . Mushroom Extract Complex Swelling  . Pollen Extract Itching    Medications Prior to Admission  Medication Sig Dispense Refill Last Dose  . Blood Pressure KIT Monitor blood pressure at home regularly z34.90 standard size cuff 1 each 0   . Prenatal Vit-Fe Phos-FA-Omega (VITAFOL GUMMIES) 3.33-0.333-34.8 MG CHEW Chew 3 tablets by mouth daily. 90 tablet 11     Review of Systems   Constitutional: Negative.   HENT: Negative.   Eyes: Negative.   Respiratory: Negative.   Cardiovascular: Negative.   Gastrointestinal: Negative.   Endocrine: Negative.   Genitourinary: Positive for pelvic pain (RLQ, occ., stabbing pain).  Musculoskeletal: Negative.   Skin: Negative.   Allergic/Immunologic: Negative.   Neurological: Negative.   Hematological: Negative.   Psychiatric/Behavioral: Negative.    Physical Exam   Blood pressure 110/69, pulse (!) 101, temperature 98.1 F (36.7 C), resp. rate 16, last menstrual period 08/22/2018, SpO2 100 %.  Physical Exam  Nursing note and vitals reviewed. Constitutional: She is oriented to person, place, and time. She appears well-developed and well-nourished.  HENT:  Head: Normocephalic and atraumatic.  Eyes: Pupils are equal, round, and reactive to light.  Neck: Normal range of motion.  Cardiovascular: Normal rate and regular rhythm.  Respiratory: Effort normal.  GI: Soft.  Genitourinary:    Genitourinary Comments: Dilation: Closed Effacement (%): Thick Cervical Position: Posterior Station: Ballotable Presentation: Undeterminable Exam by: RSunday Corn CNM    Neurological: She is alert and oriented to person, place, and time. She has normal reflexes.  Skin: Skin is warm and dry.  Psychiatric: She has a normal mood and affect.  Her behavior is normal. Judgment and thought content normal.   NST - FHR: 130 bpm / moderate variability / accels present / decels absent / TOCO: none  MAU Course  Procedures  MDM CCUA Cervical Exam Flexeril 10 mg -- resolved pain    Results for orders placed or performed during the hospital encounter of 04/16/19 (from the past 72 hour(s))  Urinalysis, Routine w reflex microscopic     Status: Abnormal   Collection Time: 04/16/19  1:16 PM  Result Value Ref Range   Color, Urine YELLOW YELLOW   APPearance HAZY (A) CLEAR   Specific Gravity, Urine 1.019 1.005 - 1.030   pH 6.0 5.0 - 8.0   Glucose,  UA NEGATIVE NEGATIVE mg/dL   Hgb urine dipstick NEGATIVE NEGATIVE   Bilirubin Urine NEGATIVE NEGATIVE   Ketones, ur NEGATIVE NEGATIVE mg/dL   Protein, ur NEGATIVE NEGATIVE mg/dL   Nitrite NEGATIVE NEGATIVE   Leukocytes,Ua SMALL (A) NEGATIVE   RBC / HPF 0-5 0 - 5 RBC/hpf   WBC, UA 0-5 0 - 5 WBC/hpf   Bacteria, UA RARE (A) NONE SEEN   Squamous Epithelial / LPF 0-5 0 - 5   Mucus PRESENT    Hyaline Casts, UA PRESENT     Comment: Performed at [facility], [address] [place], [place] [address]   Assessment and Plan  Round ligament pain - Rx for maternity support belt given - address for [employer] given - Information provided on RLP  - Advised to soak in warm water for 15-20 mins twice daily - Advised to take Tylenol 1000 mg po every 6 hours prn pain - Discharge home - Keep scheduled appt with [facility] on 04/21/2019 - Patient verbalized an understanding of the plan of care and agrees.   Laury Deep, MSN, CNM 04/16/2019, 1:33 PM

## 2019-04-21 ENCOUNTER — Ambulatory Visit (INDEPENDENT_AMBULATORY_CARE_PROVIDER_SITE_OTHER): Payer: Medicaid Other | Admitting: Family Medicine

## 2019-04-21 ENCOUNTER — Other Ambulatory Visit: Payer: Self-pay

## 2019-04-21 VITALS — BP 112/75 | HR 92 | Wt 145.0 lb

## 2019-04-21 DIAGNOSIS — Z3480 Encounter for supervision of other normal pregnancy, unspecified trimester: Secondary | ICD-10-CM

## 2019-04-21 DIAGNOSIS — Z3A30 30 weeks gestation of pregnancy: Secondary | ICD-10-CM

## 2019-04-21 DIAGNOSIS — O365930 Maternal care for other known or suspected poor fetal growth, third trimester, not applicable or unspecified: Secondary | ICD-10-CM

## 2019-04-21 DIAGNOSIS — O365920 Maternal care for other known or suspected poor fetal growth, second trimester, not applicable or unspecified: Secondary | ICD-10-CM

## 2019-04-21 NOTE — Progress Notes (Signed)
   PRENATAL VISIT NOTE  Subjective:  Tanya Rodgers is a 23 y.o. G2P1001 at [redacted]w[redacted]d being seen today for ongoing prenatal care.  She is currently monitored for the following issues for this high-risk pregnancy and has Encounter for supervision of normal pregnancy, antepartum; Atypical squamous cells cannot exclude high grade squamous intraepithelial lesion on cytologic smear of cervix (ASC-H); IUGR (intrauterine growth restriction) affecting care of mother; and Round ligament pain on their problem list.  Patient reports no complaints.  Contractions: Irritability. Vag. Bleeding: None.  Movement: Present. Denies leaking of fluid.   The following portions of the patient's history were reviewed and updated as appropriate: allergies, current medications, past family history, past medical history, past social history, past surgical history and problem list.   Objective:   Vitals:   04/21/19 1525  BP: 112/75  Pulse: 92  Weight: 145 lb (65.8 kg)    Fetal Status: Fetal Heart Rate (bpm): 140 Fundal Height: 28 cm Movement: Present     General:  Alert, oriented and cooperative. Patient is in no acute distress.  Skin: Skin is warm and dry. No rash noted.   Cardiovascular: Normal heart rate noted  Respiratory: Normal respiratory effort, no problems with respiration noted  Abdomen: Soft, gravid, appropriate for gestational age.  Pain/Pressure: Present     Pelvic: Cervical exam deferred        Extremities: Normal range of motion.  Edema: Trace  Mental Status: Normal mood and affect. Normal behavior. Normal judgment and thought content.   Assessment and Plan:  Pregnancy: G2P1001 at [redacted]w[redacted]d 1. Supervision of other normal pregnancy, antepartum 28 week labs reviewed and WNL RTC in 2 weeks; in person visit as patient cannot find BP cuff   2. Poor fetal growth affecting management of mother in second trimester, single or unspecified fetus Last [place] 10/14: Severe fetal growth restriction US Doppler and  NST scheduled for tomorrow Fetal growth assessment on 10/28 BPP's starting at 32 weeks; ordered   Preterm labor symptoms and general obstetric precautions including but not limited to vaginal bleeding, contractions, leaking of fluid and fetal movement were reviewed in detail with the patient. Please refer to After Visit Summary for other counseling recommendations.   Return in about 2 weeks (around 05/05/2019) for ROB; in-person .  Future Appointments  Date Time Provider [place]  04/22/2019  1:15 PM [place] MFC-US  04/22/2019  1:15 PM [place] 4 WH-MFCUS MFC-US  04/29/2019  3:45 PM [place] NURSE [place] [facility]FC-US  04/29/2019  3:45 PM [place] 2 WH-MFCUS MFC-US    Chauncey Mann, MD

## 2019-04-21 NOTE — Progress Notes (Signed)
ROB    CC: braxton hicks contractions at night  Pt has questions about induction.

## 2019-04-22 ENCOUNTER — Ambulatory Visit (HOSPITAL_COMMUNITY): Payer: Medicaid Other | Admitting: *Deleted

## 2019-04-22 ENCOUNTER — Encounter (HOSPITAL_COMMUNITY): Payer: Self-pay

## 2019-04-22 ENCOUNTER — Other Ambulatory Visit (HOSPITAL_COMMUNITY): Payer: Self-pay | Admitting: *Deleted

## 2019-04-22 ENCOUNTER — Ambulatory Visit (HOSPITAL_COMMUNITY)
Admission: RE | Admit: 2019-04-22 | Discharge: 2019-04-22 | Disposition: A | Discharge status: Home / Self Care | Payer: Medicaid Other | Source: Ambulatory Visit | Attending: Obstetrics and Gynecology | Admitting: Obstetrics and Gynecology

## 2019-04-22 ENCOUNTER — Ambulatory Visit (HOSPITAL_COMMUNITY): Payer: Medicaid Other

## 2019-04-22 DIAGNOSIS — O365920 Maternal care for other known or suspected poor fetal growth, second trimester, not applicable or unspecified: Secondary | ICD-10-CM | POA: Diagnosis present

## 2019-04-22 DIAGNOSIS — Z3A30 30 weeks gestation of pregnancy: Secondary | ICD-10-CM | POA: Diagnosis not present

## 2019-04-22 DIAGNOSIS — O365930 Maternal care for other known or suspected poor fetal growth, third trimester, not applicable or unspecified: Secondary | ICD-10-CM

## 2019-04-22 DIAGNOSIS — N949 Unspecified condition associated with female genital organs and menstrual cycle: Secondary | ICD-10-CM

## 2019-04-22 DIAGNOSIS — O359XX0 Maternal care for (suspected) fetal abnormality and damage, unspecified, not applicable or unspecified: Secondary | ICD-10-CM

## 2019-04-22 DIAGNOSIS — O365990 Maternal care for other known or suspected poor fetal growth, unspecified trimester, not applicable or unspecified: Secondary | ICD-10-CM | POA: Insufficient documentation

## 2019-04-22 NOTE — Procedures (Signed)
Tanya Rodgers 1995/09/10 [redacted]w[redacted]d  Fetus A Non-Stress Test Interpretation for 04/22/19  Indication: IUGR  Fetal Heart Rate A Mode: External Baseline Rate (A): 130 bpm Variability: Moderate Accelerations: 10 x 10 Decelerations: None Multiple birth?: No  Uterine Activity Mode: Toco Contraction Frequency (min): none noted  Interpretation (Fetal Testing) Nonstress Test Interpretation: Reactive Comments: FHR tracing rev'd by Dr. Annamaria Boots

## 2019-04-29 ENCOUNTER — Ambulatory Visit (HOSPITAL_COMMUNITY): Payer: Medicaid Other | Admitting: *Deleted

## 2019-04-29 ENCOUNTER — Other Ambulatory Visit: Payer: Self-pay

## 2019-04-29 ENCOUNTER — Encounter (HOSPITAL_COMMUNITY): Payer: Self-pay | Admitting: *Deleted

## 2019-04-29 ENCOUNTER — Other Ambulatory Visit (HOSPITAL_COMMUNITY): Payer: Self-pay | Admitting: Obstetrics and Gynecology

## 2019-04-29 ENCOUNTER — Ambulatory Visit (HOSPITAL_COMMUNITY)
Admission: RE | Admit: 2019-04-29 | Discharge: 2019-04-29 | Disposition: A | Discharge status: Home / Self Care | Payer: Medicaid Other | Source: Ambulatory Visit | Attending: Obstetrics and Gynecology | Admitting: Obstetrics and Gynecology

## 2019-04-29 DIAGNOSIS — N949 Unspecified condition associated with female genital organs and menstrual cycle: Secondary | ICD-10-CM

## 2019-04-29 DIAGNOSIS — O359XX0 Maternal care for (suspected) fetal abnormality and damage, unspecified, not applicable or unspecified: Secondary | ICD-10-CM

## 2019-04-29 DIAGNOSIS — O365990 Maternal care for other known or suspected poor fetal growth, unspecified trimester, not applicable or unspecified: Secondary | ICD-10-CM

## 2019-04-29 DIAGNOSIS — Z362 Encounter for other antenatal screening follow-up: Secondary | ICD-10-CM | POA: Diagnosis not present

## 2019-04-29 DIAGNOSIS — O365930 Maternal care for other known or suspected poor fetal growth, third trimester, not applicable or unspecified: Secondary | ICD-10-CM | POA: Insufficient documentation

## 2019-04-29 DIAGNOSIS — Z3A31 31 weeks gestation of pregnancy: Secondary | ICD-10-CM

## 2019-04-29 NOTE — Procedures (Signed)
Tanya Rodgers 1995/07/30 [redacted]w[redacted]d  Fetus A Non-Stress Test Interpretation for 04/29/19  Indication: IUGR  Fetal Heart Rate A Mode: External Baseline Rate (A): 130 bpm Variability: Moderate Accelerations: None Decelerations: None Multiple birth?: No  Uterine Activity Mode: Palpation, Toco Contraction Frequency (min): None Resting Tone Palpated: Relaxed Resting Time: Adequate  Interpretation (Fetal Testing) Nonstress Test Interpretation: Non-reactive Overall Impression: Reassuring for gestational age Comments: EFM tracing reviewed by Dr. Gertie Exon. Proceeding with BPP.

## 2019-04-29 NOTE — Progress Notes (Signed)
103 60 85 98.4  

## 2019-05-05 ENCOUNTER — Ambulatory Visit (HOSPITAL_COMMUNITY): Payer: Medicaid Other | Admitting: *Deleted

## 2019-05-05 ENCOUNTER — Encounter: Payer: Self-pay | Admitting: Obstetrics and Gynecology

## 2019-05-05 ENCOUNTER — Encounter (HOSPITAL_COMMUNITY): Payer: Self-pay | Admitting: *Deleted

## 2019-05-05 ENCOUNTER — Other Ambulatory Visit: Payer: Self-pay | Admitting: Family Medicine

## 2019-05-05 ENCOUNTER — Other Ambulatory Visit: Payer: Self-pay

## 2019-05-05 ENCOUNTER — Ambulatory Visit (HOSPITAL_COMMUNITY)
Admission: RE | Admit: 2019-05-05 | Discharge: 2019-05-05 | Disposition: A | Discharge status: Home / Self Care | Payer: Medicaid Other | Source: Ambulatory Visit | Attending: Obstetrics and Gynecology | Admitting: Obstetrics and Gynecology

## 2019-05-05 DIAGNOSIS — O365930 Maternal care for other known or suspected poor fetal growth, third trimester, not applicable or unspecified: Secondary | ICD-10-CM

## 2019-05-05 DIAGNOSIS — Z3A32 32 weeks gestation of pregnancy: Secondary | ICD-10-CM

## 2019-05-05 DIAGNOSIS — O359XX0 Maternal care for (suspected) fetal abnormality and damage, unspecified, not applicable or unspecified: Secondary | ICD-10-CM

## 2019-05-05 DIAGNOSIS — O365920 Maternal care for other known or suspected poor fetal growth, second trimester, not applicable or unspecified: Secondary | ICD-10-CM

## 2019-05-05 DIAGNOSIS — N949 Unspecified condition associated with female genital organs and menstrual cycle: Secondary | ICD-10-CM | POA: Diagnosis present

## 2019-05-07 ENCOUNTER — Other Ambulatory Visit (HOSPITAL_COMMUNITY): Payer: Self-pay | Admitting: *Deleted

## 2019-05-07 DIAGNOSIS — O365990 Maternal care for other known or suspected poor fetal growth, unspecified trimester, not applicable or unspecified: Secondary | ICD-10-CM

## 2019-05-12 ENCOUNTER — Ambulatory Visit (HOSPITAL_COMMUNITY)
Admission: RE | Admit: 2019-05-12 | Discharge: 2019-05-12 | Disposition: A | Discharge status: Home / Self Care | Payer: Medicaid Other | Source: Ambulatory Visit | Attending: Maternal & Fetal Medicine | Admitting: Maternal & Fetal Medicine

## 2019-05-12 ENCOUNTER — Encounter (HOSPITAL_COMMUNITY): Payer: Self-pay | Admitting: *Deleted

## 2019-05-12 ENCOUNTER — Ambulatory Visit (HOSPITAL_COMMUNITY): Payer: Medicaid Other | Admitting: *Deleted

## 2019-05-12 ENCOUNTER — Other Ambulatory Visit: Payer: Self-pay

## 2019-05-12 DIAGNOSIS — N949 Unspecified condition associated with female genital organs and menstrual cycle: Secondary | ICD-10-CM

## 2019-05-12 DIAGNOSIS — O365990 Maternal care for other known or suspected poor fetal growth, unspecified trimester, not applicable or unspecified: Secondary | ICD-10-CM | POA: Insufficient documentation

## 2019-05-12 DIAGNOSIS — O359XX0 Maternal care for (suspected) fetal abnormality and damage, unspecified, not applicable or unspecified: Secondary | ICD-10-CM | POA: Diagnosis not present

## 2019-05-12 DIAGNOSIS — O365930 Maternal care for other known or suspected poor fetal growth, third trimester, not applicable or unspecified: Secondary | ICD-10-CM

## 2019-05-12 DIAGNOSIS — Z3A33 33 weeks gestation of pregnancy: Secondary | ICD-10-CM

## 2019-05-19 ENCOUNTER — Ambulatory Visit (HOSPITAL_COMMUNITY): Payer: Medicaid Other

## 2019-05-19 ENCOUNTER — Encounter (HOSPITAL_COMMUNITY): Payer: Self-pay | Admitting: *Deleted

## 2019-05-19 ENCOUNTER — Other Ambulatory Visit: Payer: Self-pay

## 2019-05-19 ENCOUNTER — Ambulatory Visit (HOSPITAL_COMMUNITY): Payer: Medicaid Other | Admitting: *Deleted

## 2019-05-19 ENCOUNTER — Ambulatory Visit (HOSPITAL_COMMUNITY)
Admission: RE | Admit: 2019-05-19 | Discharge: 2019-05-19 | Disposition: A | Discharge status: Home / Self Care | Payer: Medicaid Other | Source: Ambulatory Visit | Attending: Maternal & Fetal Medicine | Admitting: Maternal & Fetal Medicine

## 2019-05-19 DIAGNOSIS — O365990 Maternal care for other known or suspected poor fetal growth, unspecified trimester, not applicable or unspecified: Secondary | ICD-10-CM | POA: Diagnosis present

## 2019-05-19 DIAGNOSIS — N949 Unspecified condition associated with female genital organs and menstrual cycle: Secondary | ICD-10-CM | POA: Insufficient documentation

## 2019-05-19 DIAGNOSIS — Z3A34 34 weeks gestation of pregnancy: Secondary | ICD-10-CM | POA: Diagnosis not present

## 2019-05-19 DIAGNOSIS — O359XX0 Maternal care for (suspected) fetal abnormality and damage, unspecified, not applicable or unspecified: Secondary | ICD-10-CM

## 2019-05-19 DIAGNOSIS — O365930 Maternal care for other known or suspected poor fetal growth, third trimester, not applicable or unspecified: Secondary | ICD-10-CM | POA: Diagnosis not present

## 2019-05-19 DIAGNOSIS — Z362 Encounter for other antenatal screening follow-up: Secondary | ICD-10-CM | POA: Diagnosis not present

## 2019-05-20 ENCOUNTER — Ambulatory Visit (INDEPENDENT_AMBULATORY_CARE_PROVIDER_SITE_OTHER): Payer: Medicaid Other | Admitting: Family Medicine

## 2019-05-20 VITALS — BP 106/71 | HR 99 | Wt 148.0 lb

## 2019-05-20 DIAGNOSIS — O365930 Maternal care for other known or suspected poor fetal growth, third trimester, not applicable or unspecified: Secondary | ICD-10-CM

## 2019-05-20 DIAGNOSIS — Z3480 Encounter for supervision of other normal pregnancy, unspecified trimester: Secondary | ICD-10-CM

## 2019-05-20 DIAGNOSIS — Z3A34 34 weeks gestation of pregnancy: Secondary | ICD-10-CM | POA: Diagnosis not present

## 2019-05-20 DIAGNOSIS — O365920 Maternal care for other known or suspected poor fetal growth, second trimester, not applicable or unspecified: Secondary | ICD-10-CM

## 2019-05-20 NOTE — Progress Notes (Signed)
   PRENATAL VISIT NOTE  Subjective:  Tanya Rodgers is a 23 y.o. G2P1001 at [redacted]w[redacted]d being seen today for ongoing prenatal care.  She is currently monitored for the following issues for this high-risk pregnancy and has Encounter for supervision of normal pregnancy, antepartum; Atypical squamous cells cannot exclude high grade squamous intraepithelial lesion on cytologic smear of cervix (ASC-H); IUGR (intrauterine growth restriction) affecting care of mother; and Round ligament pain on their problem list.  Patient reports no complaints.  Contractions: Irregular. Vag. Bleeding: None.  Movement: Present. Denies leaking of fluid.   The following portions of the patient's history were reviewed and updated as appropriate: allergies, current medications, past family history, past medical history, past social history, past surgical history and problem list.   Objective:   Vitals:   05/20/19 1518  BP: 106/71  Pulse: 99  Weight: 148 lb (67.1 kg)    Fetal Status:   Fundal Height: 33 cm Movement: Present     General:  Alert, oriented and cooperative. Patient is in no acute distress.  Skin: Skin is warm and dry. No rash noted.   Cardiovascular: Normal heart rate noted  Respiratory: Normal respiratory effort, no problems with respiration noted  Abdomen: Soft, gravid, appropriate for gestational age.  Pain/Pressure: Present     Pelvic: Cervical exam deferred        Extremities: Normal range of motion.     Mental Status: Normal mood and affect. Normal behavior. Normal judgment and thought content.   Assessment and Plan:  Pregnancy: G2P1001 at [redacted]w[redacted]d Diagnoses and all orders for this visit:  Supervision of other normal pregnancy, antepartum - RTC in ~  10-12 days for 36 week swabs as IOL at 37w - On [place] 11/17: Intracranial evaluation showed a small cystic structure superior and anterior to the thalamus. Color Doppler flow was negative. Differential diagnosis include arachnoid cyst or enlarged third  ventricle.  Poor fetal growth affecting management of mother in second trimester, single or unspecified fetus - NST done today per Dr. Donalee Citrin request: 130, reactive, moderate variability, pos accels, no decels, No ctx. - Last [place] 11/17: EFW < 1st %tile - BPP, UA Doppler and NST next week - Delivery at 37w per MFM; schedule IOL at next visit   Preterm labor symptoms and general obstetric precautions including but not limited to vaginal bleeding, contractions, leaking of fluid and fetal movement were reviewed in detail with the patient. Please refer to After Visit Summary for other counseling recommendations.   Return in about 12 days (around 06/01/2019).  Future Appointments  Date Time Provider [place]  05/20/2019  3:45 PM Chauncey Mann, MD [facility] None  05/26/2019  3:30 PM [place] NURSE [place] MFC-US  05/26/2019  3:30 PM [place] 1 WH-MFCUS MFC-US  06/03/2019  3:30 PM [place] [facility]-US  06/03/2019  3:30 PM [place] 1 WH-MFCUS MFC-US    Chauncey Mann, MD

## 2019-05-20 NOTE — Addendum Note (Signed)
Addended by: Lewie Loron D on: 05/20/2019 03:59 PM   Modules accepted: Orders

## 2019-05-26 ENCOUNTER — Encounter (HOSPITAL_COMMUNITY): Payer: Self-pay | Admitting: *Deleted

## 2019-05-26 ENCOUNTER — Ambulatory Visit (HOSPITAL_COMMUNITY): Payer: Medicaid Other | Admitting: *Deleted

## 2019-05-26 ENCOUNTER — Ambulatory Visit (HOSPITAL_COMMUNITY): Payer: Medicaid Other

## 2019-05-26 ENCOUNTER — Ambulatory Visit (HOSPITAL_COMMUNITY)
Admission: RE | Admit: 2019-05-26 | Discharge: 2019-05-26 | Disposition: A | Discharge status: Home / Self Care | Payer: Medicaid Other | Source: Ambulatory Visit | Attending: Maternal & Fetal Medicine | Admitting: Maternal & Fetal Medicine

## 2019-05-26 ENCOUNTER — Other Ambulatory Visit: Payer: Self-pay

## 2019-05-26 DIAGNOSIS — O365990 Maternal care for other known or suspected poor fetal growth, unspecified trimester, not applicable or unspecified: Secondary | ICD-10-CM | POA: Diagnosis not present

## 2019-05-26 DIAGNOSIS — N949 Unspecified condition associated with female genital organs and menstrual cycle: Secondary | ICD-10-CM | POA: Insufficient documentation

## 2019-05-26 DIAGNOSIS — O365930 Maternal care for other known or suspected poor fetal growth, third trimester, not applicable or unspecified: Secondary | ICD-10-CM | POA: Insufficient documentation

## 2019-05-26 DIAGNOSIS — O359XX0 Maternal care for (suspected) fetal abnormality and damage, unspecified, not applicable or unspecified: Secondary | ICD-10-CM

## 2019-05-26 DIAGNOSIS — Z3A36 36 weeks gestation of pregnancy: Secondary | ICD-10-CM

## 2019-05-27 LAB — CMV IGM: CMV IgM Ser EIA-aCnc: <30 AU/mL (ref 0.0–29.9)

## 2019-05-27 LAB — CMV ANTIBODY, IGG (EIA): CMV Ab - IgG: <0.6 U/mL (ref 0.00–0.59)

## 2019-05-27 LAB — TOXOPLASMA GONDII ANTIBODY, IGG: Toxoplasma IgG Ratio: <3 IU/mL (ref 0.0–7.1)

## 2019-05-27 LAB — TOXOPLASMA GONDII ANTIBODY, IGM: Toxoplasma Antibody- IgM: <3 AU/mL (ref 0.0–7.9)

## 2019-05-27 LAB — INFECT DISEASE AB IGM REFLEX 1

## 2019-05-31 ENCOUNTER — Other Ambulatory Visit: Payer: Self-pay

## 2019-05-31 ENCOUNTER — Encounter (HOSPITAL_COMMUNITY): Payer: Self-pay | Admitting: *Deleted

## 2019-05-31 ENCOUNTER — Inpatient Hospital Stay (HOSPITAL_COMMUNITY)
Admission: AD | Admit: 2019-05-31 | Discharge: 2019-05-31 | Disposition: A | Discharge status: Home / Self Care | Payer: Medicaid Other | Attending: Obstetrics and Gynecology | Admitting: Obstetrics and Gynecology

## 2019-05-31 DIAGNOSIS — Z3A36 36 weeks gestation of pregnancy: Secondary | ICD-10-CM | POA: Diagnosis not present

## 2019-05-31 DIAGNOSIS — O4703 False labor before 37 completed weeks of gestation, third trimester: Secondary | ICD-10-CM | POA: Diagnosis not present

## 2019-05-31 DIAGNOSIS — O479 False labor, unspecified: Secondary | ICD-10-CM

## 2019-05-31 NOTE — Discharge Instructions (Signed)
Braxton Hicks Contractions Contractions of the uterus can occur throughout pregnancy, but they are not always a sign that you are in labor. You may have practice contractions called Braxton Hicks contractions. These false labor contractions are sometimes confused with true labor. What are Braxton Hicks contractions? Braxton Hicks contractions are tightening movements that occur in the muscles of the uterus before labor. Unlike true labor contractions, these contractions do not result in opening (dilation) and thinning of the cervix. Toward the end of pregnancy (32-34 weeks), Braxton Hicks contractions can happen more often and may become stronger. These contractions are sometimes difficult to tell apart from true labor because they can be very uncomfortable. You should not feel embarrassed if you go to the hospital with false labor. Sometimes, the only way to tell if you are in true labor is for your health care provider to look for changes in the cervix. The health care provider will do a physical exam and may monitor your contractions. If you are not in true labor, the exam should show that your cervix is not dilating and your water has not broken. If there are no other health problems associated with your pregnancy, it is completely safe for you to be sent home with false labor. You may continue to have Braxton Hicks contractions until you go into true labor. How to tell the difference between true labor and false labor True labor  Contractions last 30-70 seconds.  Contractions become very regular.  Discomfort is usually felt in the top of the uterus, and it spreads to the lower abdomen and low back.  Contractions do not go away with walking.  Contractions usually become more intense and increase in frequency.  The cervix dilates and gets thinner. False labor  Contractions are usually shorter and not as strong as true labor contractions.  Contractions are usually irregular.  Contractions  are often felt in the front of the lower abdomen and in the groin.  Contractions may go away when you walk around or change positions while lying down.  Contractions get weaker and are shorter-lasting as time goes on.  The cervix usually does not dilate or become thin. Follow these instructions at home:   Take over-the-counter and prescription medicines only as told by your health care provider.  Keep up with your usual exercises and follow other instructions from your health care provider.  Eat and drink lightly if you think you are going into labor.  If Braxton Hicks contractions are making you uncomfortable: ? Change your position from lying down or resting to walking, or change from walking to resting. ? Sit and rest in a tub of warm water. ? Drink enough fluid to keep your urine pale yellow. Dehydration may cause these contractions. ? Do slow and deep breathing several times an hour.  Keep all follow-up prenatal visits as told by your health care provider. This is important. Contact a health care provider if:  You have a fever.  You have continuous pain in your abdomen. Get help right away if:  Your contractions become stronger, more regular, and closer together.  You have fluid leaking or gushing from your vagina.  You pass blood-tinged mucus (bloody show).  You have bleeding from your vagina.  You have low back pain that you never had before.  You feel your baby's head pushing down and causing pelvic pressure.  Your baby is not moving inside you as much as it used to. Summary  Contractions that occur before labor are   called Braxton Hicks contractions, false labor, or practice contractions.  Braxton Hicks contractions are usually shorter, weaker, farther apart, and less regular than true labor contractions. True labor contractions usually become progressively stronger and regular, and they become more frequent.  Manage discomfort from Braxton Hicks contractions  by changing position, resting in a warm bath, drinking plenty of water, or practicing deep breathing. This information is not intended to replace advice given to you by your health care provider. Make sure you discuss any questions you have with your health care provider. Document Released: 11/01/2016 Document Revised: 05/31/2017 Document Reviewed: 11/01/2016 Elsevier Patient Education  2020 [employer]  

## 2019-05-31 NOTE — MAU Note (Signed)
Tanya Rodgers is a 23 y.o. at [redacted]w[redacted]d here in MAU reporting: contractions last night and states they started back today, they are about every 10 minutes. States they only happen when the baby pushes his head down. No bleeding or LOF. +FM  Onset of complaint: yesterday  Pain score: 10/10  Vitals:   05/31/19 1439  BP: (!) 96/58  Pulse: 86  Resp: 16  Temp: 98.1 F (36.7 C)  SpO2: 100%     FHT: +FM  Lab orders placed from triage: none

## 2019-05-31 NOTE — MAU Provider Note (Signed)
S: Ms. Tanya Rodgers is a 23 y.o. G2P1001 at [redacted]w[redacted]d  who presents to MAU today complaining contractions q 10 minutes since 1200. She denies vaginal bleeding. She denies LOF. She reports normal fetal movement.    O: BP (!) 96/58 (BP Location: Left Arm)   Pulse 86   Temp 98.1 F (36.7 C) (Oral)   Resp 16   LMP 08/22/2018   SpO2 100%  GENERAL: Well-developed, well-nourished female in no acute distress.  HEAD: Normocephalic, atraumatic.  CHEST: Normal effort of breathing, regular heart rate ABDOMEN: Soft, nontender, gravid  Cervical exam:  Dilation: Fingertip Effacement (%): Thick Cervical Position: Posterior Station: Ballotable Presentation: Undeterminable Exam by:: A. Gagliardo, RN   Fetal Monitoring: Baseline: 125 Variability: moderate Accelerations: 15x15 Decelerations: none Contractions: occasional uc's   A: SIUP at [redacted]w[redacted]d  False labor  P: Discharge home Patient to follow up as scheduled with [facility] Patient may return to MAU as needed.   Wende Mott, [place] 05/31/2019 3:05 PM

## 2019-06-02 ENCOUNTER — Other Ambulatory Visit: Payer: Self-pay

## 2019-06-02 ENCOUNTER — Other Ambulatory Visit (HOSPITAL_COMMUNITY)
Admission: RE | Admit: 2019-06-02 | Discharge: 2019-06-02 | Disposition: A | Discharge status: Home / Self Care | Payer: Medicaid Other | Source: Ambulatory Visit | Attending: Obstetrics & Gynecology | Admitting: Obstetrics & Gynecology

## 2019-06-02 ENCOUNTER — Encounter: Payer: Self-pay | Admitting: Obstetrics & Gynecology

## 2019-06-02 ENCOUNTER — Ambulatory Visit (INDEPENDENT_AMBULATORY_CARE_PROVIDER_SITE_OTHER): Payer: Medicaid Other | Admitting: Obstetrics & Gynecology

## 2019-06-02 VITALS — BP 112/72 | HR 84 | Wt 152.4 lb

## 2019-06-02 DIAGNOSIS — Z3A36 36 weeks gestation of pregnancy: Secondary | ICD-10-CM

## 2019-06-02 DIAGNOSIS — Z3480 Encounter for supervision of other normal pregnancy, unspecified trimester: Secondary | ICD-10-CM | POA: Insufficient documentation

## 2019-06-02 DIAGNOSIS — O365930 Maternal care for other known or suspected poor fetal growth, third trimester, not applicable or unspecified: Secondary | ICD-10-CM

## 2019-06-02 NOTE — Patient Instructions (Signed)

## 2019-06-02 NOTE — Progress Notes (Signed)
   PRENATAL VISIT NOTE  Subjective:  Tanya Rodgers is a 23 y.o. G2P1001 at [redacted]w[redacted]d being seen today for ongoing prenatal care.  She is currently monitored for the following issues for this high-risk pregnancy and has Encounter for supervision of normal pregnancy, antepartum; Atypical squamous cells cannot exclude high grade squamous intraepithelial lesion on cytologic smear of cervix (ASC-H); IUGR (intrauterine growth restriction) affecting care of mother; and Round ligament pain on their problem list.  Patient reports no complaints.  Contractions: Irregular. Vag. Bleeding: None.  Movement: Present. Denies leaking of fluid.   The following portions of the patient's history were reviewed and updated as appropriate: allergies, current medications, past family history, past medical history, past social history, past surgical history and problem list.   Objective:   Vitals:   06/02/19 1547  BP: 112/72  Pulse: 84  Weight: 152 lb 6.4 oz (69.1 kg)    Fetal Status: Fetal Heart Rate (bpm): 134   Movement: Present     General:  Alert, oriented and cooperative. Patient is in no acute distress.  Skin: Skin is warm and dry. No rash noted.   Cardiovascular: Normal heart rate noted  Respiratory: Normal respiratory effort, no problems with respiration noted  Abdomen: Soft, gravid, appropriate for gestational age.  Pain/Pressure: Present     Pelvic: Cervical exam performed        Extremities: Normal range of motion.     Mental Status: Normal mood and affect. Normal behavior. Normal judgment and thought content.   Assessment and Plan:  Pregnancy: G2P1001 at [redacted]w[redacted]d 1. Supervision of other normal pregnancy, antepartum Routine testing - Strep Gp B NAA - Cervicovaginal ancillary only( [place])  2. Poor fetal growth affecting management of mother in third trimester, single or unspecified fetus IOL 37 weeks  Preterm labor symptoms and general obstetric precautions including but not limited to  vaginal bleeding, contractions, leaking of fluid and fetal movement were reviewed in detail with the patient. Please refer to After Visit Summary for other counseling recommendations.   Return in about 2 weeks (around 06/16/2019) for postpartum.  Future Appointments  Date Time Provider [place]  06/03/2019  3:30 PM [facility] NURSE [facility]  06/03/2019  3:30 PM [place] 1 WH-MFCUS MFC-US    Emeterio Reeve, MD

## 2019-06-02 NOTE — Progress Notes (Signed)
Patient reports fetal movement with irregular contractions. 

## 2019-06-03 ENCOUNTER — Ambulatory Visit (HOSPITAL_COMMUNITY)
Admission: RE | Admit: 2019-06-03 | Discharge: 2019-06-03 | Disposition: A | Discharge status: Home / Self Care | Payer: Medicaid Other | Source: Ambulatory Visit | Attending: Maternal & Fetal Medicine | Admitting: Maternal & Fetal Medicine

## 2019-06-03 ENCOUNTER — Other Ambulatory Visit: Payer: Self-pay | Admitting: Advanced Practice Midwife

## 2019-06-03 ENCOUNTER — Other Ambulatory Visit: Payer: Self-pay | Admitting: Family Medicine

## 2019-06-03 ENCOUNTER — Encounter (HOSPITAL_COMMUNITY): Payer: Self-pay

## 2019-06-03 ENCOUNTER — Ambulatory Visit (HOSPITAL_COMMUNITY): Payer: Medicaid Other | Admitting: *Deleted

## 2019-06-03 DIAGNOSIS — O365930 Maternal care for other known or suspected poor fetal growth, third trimester, not applicable or unspecified: Secondary | ICD-10-CM | POA: Insufficient documentation

## 2019-06-03 DIAGNOSIS — Z3A36 36 weeks gestation of pregnancy: Secondary | ICD-10-CM

## 2019-06-03 DIAGNOSIS — O365990 Maternal care for other known or suspected poor fetal growth, unspecified trimester, not applicable or unspecified: Secondary | ICD-10-CM | POA: Insufficient documentation

## 2019-06-03 DIAGNOSIS — Z3A37 37 weeks gestation of pregnancy: Secondary | ICD-10-CM

## 2019-06-03 DIAGNOSIS — N949 Unspecified condition associated with female genital organs and menstrual cycle: Secondary | ICD-10-CM | POA: Diagnosis present

## 2019-06-03 DIAGNOSIS — O359XX0 Maternal care for (suspected) fetal abnormality and damage, unspecified, not applicable or unspecified: Secondary | ICD-10-CM

## 2019-06-03 NOTE — Progress Notes (Signed)
Pt reports mucous bright red scant blood from vagina yesterday afternoon after BM.  Pt states she isn't having anymore after that initial episode and doesn't need to wear a sanitary pad.  Pt also reports she had a cervical exam yesterday in office.  Good fetal movement.

## 2019-06-04 ENCOUNTER — Telehealth (HOSPITAL_COMMUNITY): Payer: Self-pay | Admitting: *Deleted

## 2019-06-04 LAB — CERVICOVAGINAL ANCILLARY ONLY
Chlamydia: NEGATIVE
Comment: NEGATIVE
Comment: NORMAL
Neisseria Gonorrhea: NEGATIVE

## 2019-06-04 LAB — STREP GP B NAA: Strep Gp B NAA: NEGATIVE

## 2019-06-04 NOTE — Telephone Encounter (Signed)
Preadmission screen  

## 2019-06-05 ENCOUNTER — Other Ambulatory Visit (HOSPITAL_COMMUNITY): Payer: Medicaid Other

## 2019-06-05 ENCOUNTER — Other Ambulatory Visit (HOSPITAL_COMMUNITY)
Admission: RE | Admit: 2019-06-05 | Discharge: 2019-06-05 | Disposition: A | Discharge status: Home / Self Care | Payer: Medicaid Other | Source: Ambulatory Visit | Attending: Obstetrics & Gynecology | Admitting: Obstetrics & Gynecology

## 2019-06-05 DIAGNOSIS — Z20828 Contact with and (suspected) exposure to other viral communicable diseases: Secondary | ICD-10-CM | POA: Insufficient documentation

## 2019-06-05 DIAGNOSIS — Z01812 Encounter for preprocedural laboratory examination: Secondary | ICD-10-CM | POA: Diagnosis present

## 2019-06-05 LAB — SARS CORONAVIRUS 2 (TAT 6-24 HRS): SARS Coronavirus 2: NEGATIVE

## 2019-06-07 ENCOUNTER — Inpatient Hospital Stay (HOSPITAL_COMMUNITY): Payer: Medicaid Other | Admitting: Anesthesiology

## 2019-06-07 ENCOUNTER — Other Ambulatory Visit: Payer: Self-pay | Admitting: Family Medicine

## 2019-06-07 ENCOUNTER — Inpatient Hospital Stay (HOSPITAL_COMMUNITY)
Admission: AD | Admit: 2019-06-07 | Discharge: 2019-06-09 | DRG: 807 | Disposition: A | Discharge status: Home / Self Care | Payer: Medicaid Other | Attending: Obstetrics & Gynecology | Admitting: Obstetrics & Gynecology

## 2019-06-07 ENCOUNTER — Inpatient Hospital Stay (HOSPITAL_COMMUNITY): Payer: Medicaid Other

## 2019-06-07 ENCOUNTER — Encounter (HOSPITAL_COMMUNITY): Payer: Self-pay | Admitting: *Deleted

## 2019-06-07 ENCOUNTER — Other Ambulatory Visit: Payer: Self-pay

## 2019-06-07 DIAGNOSIS — Z3A37 37 weeks gestation of pregnancy: Secondary | ICD-10-CM

## 2019-06-07 DIAGNOSIS — O365930 Maternal care for other known or suspected poor fetal growth, third trimester, not applicable or unspecified: Secondary | ICD-10-CM | POA: Diagnosis present

## 2019-06-07 DIAGNOSIS — N949 Unspecified condition associated with female genital organs and menstrual cycle: Secondary | ICD-10-CM

## 2019-06-07 DIAGNOSIS — Z30017 Encounter for initial prescription of implantable subdermal contraceptive: Secondary | ICD-10-CM

## 2019-06-07 LAB — CBC
HCT: 34.6 % — ABNORMAL LOW (ref 36.0–46.0)
Hemoglobin: 11 g/dL — ABNORMAL LOW (ref 12.0–15.0)
MCH: 27.9 pg (ref 26.0–34.0)
MCHC: 31.8 g/dL (ref 30.0–36.0)
MCV: 87.8 fL (ref 80.0–100.0)
Platelets: 270 10*3/uL (ref 150–400)
RBC: 3.94 MIL/uL (ref 3.87–5.11)
RDW: 14 % (ref 11.5–15.5)
WBC: 12.5 10*3/uL — ABNORMAL HIGH (ref 4.0–10.5)
nRBC: 0 % (ref 0.0–0.2)

## 2019-06-07 LAB — TYPE AND SCREEN
ABO/RH(D): O POS
Antibody Screen: NEGATIVE

## 2019-06-07 LAB — RPR: RPR Ser Ql: NONREACTIVE

## 2019-06-07 MED ORDER — TERBUTALINE SULFATE 1 MG/ML IJ SOLN: 0.2500 mg | Freq: Once | INTRAMUSCULAR | Status: DC | PRN

## 2019-06-07 MED ORDER — PHENYLEPHRINE 40 MCG/ML (10ML) SYRINGE FOR IV PUSH (FOR BLOOD PRESSURE SUPPORT): 80.0000 ug | PREFILLED_SYRINGE | INTRAVENOUS | Status: DC | PRN

## 2019-06-07 MED ORDER — OXYCODONE-ACETAMINOPHEN 5-325 MG PO TABS: 1.0000 | ORAL_TABLET | ORAL | Status: DC | PRN

## 2019-06-07 MED ORDER — LIDOCAINE HCL (PF) 1 % IJ SOLN: 30.0000 mL | INTRAMUSCULAR | Status: DC | PRN

## 2019-06-07 MED ORDER — TETANUS-DIPHTH-ACELL PERTUSSIS 5-2.5-18.5 LF-MCG/0.5 IM SUSP: 0.5000 mL | Freq: Once | INTRAMUSCULAR | Status: DC

## 2019-06-07 MED ORDER — OXYCODONE-ACETAMINOPHEN 5-325 MG PO TABS: 2.0000 | ORAL_TABLET | ORAL | Status: DC | PRN

## 2019-06-07 MED ORDER — DIPHENHYDRAMINE HCL 25 MG PO CAPS: 25.0000 mg | ORAL_CAPSULE | Freq: Four times a day (QID) | ORAL | Status: DC | PRN

## 2019-06-07 MED ORDER — DIBUCAINE (PERIANAL) 1 % EX OINT: 1.0000 "application " | TOPICAL_OINTMENT | CUTANEOUS | Status: DC | PRN

## 2019-06-07 MED ORDER — WITCH HAZEL-GLYCERIN EX PADS: 1.0000 "application " | MEDICATED_PAD | CUTANEOUS | Status: DC | PRN

## 2019-06-07 MED ORDER — OXYTOCIN 40 UNITS IN NORMAL SALINE INFUSION - SIMPLE MED
1.0000 m[IU]/min | INTRAVENOUS | Status: DC
  Administered 2019-06-07: 18:00:00 2 m[IU]/min via INTRAVENOUS

## 2019-06-07 MED ORDER — ACETAMINOPHEN 325 MG PO TABS: 650.0000 mg | ORAL_TABLET | ORAL | Status: DC | PRN

## 2019-06-07 MED ORDER — LACTATED RINGERS IV SOLN
INTRAVENOUS | Status: DC
  Administered 2019-06-07 (×3): via INTRAVENOUS

## 2019-06-07 MED ORDER — BENZOCAINE-MENTHOL 20-0.5 % EX AERO: 1.0000 "application " | INHALATION_SPRAY | CUTANEOUS | Status: DC | PRN

## 2019-06-07 MED ORDER — ONDANSETRON HCL 4 MG PO TABS: 4.0000 mg | ORAL_TABLET | ORAL | Status: DC | PRN

## 2019-06-07 MED ORDER — IBUPROFEN 600 MG PO TABS
600.0000 mg | ORAL_TABLET | Freq: Four times a day (QID) | ORAL | Status: DC
  Administered 2019-06-07 – 2019-06-09 (×6): 600 mg via ORAL
  Filled 2019-06-07 (×7): qty 1

## 2019-06-07 MED ORDER — LIDOCAINE HCL (PF) 1 % IJ SOLN
INTRAMUSCULAR | Status: DC | PRN
  Administered 2019-06-07: 5 mL via EPIDURAL
  Administered 2019-06-07: 6 mL via EPIDURAL

## 2019-06-07 MED ORDER — EPHEDRINE 5 MG/ML INJ: 10.0000 mg | INTRAVENOUS | Status: DC | PRN

## 2019-06-07 MED ORDER — MISOPROSTOL 50MCG HALF TABLET
50.0000 ug | ORAL_TABLET | ORAL | Status: DC
  Administered 2019-06-07 (×2): 50 ug via ORAL
  Filled 2019-06-07 (×2): qty 1

## 2019-06-07 MED ORDER — DIPHENHYDRAMINE HCL 50 MG/ML IJ SOLN
12.5000 mg | INTRAMUSCULAR | Status: DC | PRN
  Administered 2019-06-07: 13:00:00 12.5 mg via INTRAVENOUS
  Filled 2019-06-07: qty 1

## 2019-06-07 MED ORDER — ONDANSETRON HCL 4 MG/2ML IJ SOLN: 4.0000 mg | INTRAMUSCULAR | Status: DC | PRN

## 2019-06-07 MED ORDER — SOD CITRATE-CITRIC ACID 500-334 MG/5ML PO SOLN: 30.0000 mL | ORAL | Status: DC | PRN

## 2019-06-07 MED ORDER — COCONUT OIL OIL: 1.0000 "application " | TOPICAL_OIL | Status: DC | PRN

## 2019-06-07 MED ORDER — FENTANYL CITRATE (PF) 100 MCG/2ML IJ SOLN
100.0000 ug | INTRAMUSCULAR | Status: DC | PRN
  Administered 2019-06-07: 13:00:00 100 ug via INTRAVENOUS
  Filled 2019-06-07: qty 2

## 2019-06-07 MED ORDER — FENTANYL-BUPIVACAINE-NACL 0.5-0.125-0.9 MG/250ML-% EP SOLN
12.0000 mL/h | EPIDURAL | Status: DC | PRN
  Filled 2019-06-07: qty 250

## 2019-06-07 MED ORDER — ONDANSETRON HCL 4 MG/2ML IJ SOLN: 4.0000 mg | Freq: Four times a day (QID) | INTRAMUSCULAR | Status: DC | PRN

## 2019-06-07 MED ORDER — ZOLPIDEM TARTRATE 5 MG PO TABS: 5.0000 mg | ORAL_TABLET | Freq: Every evening | ORAL | Status: DC | PRN

## 2019-06-07 MED ORDER — OXYTOCIN BOLUS FROM INFUSION
500.0000 mL | Freq: Once | INTRAVENOUS | Status: AC
  Administered 2019-06-07: 20:00:00 500 mL via INTRAVENOUS

## 2019-06-07 MED ORDER — SENNOSIDES-DOCUSATE SODIUM 8.6-50 MG PO TABS
2.0000 | ORAL_TABLET | ORAL | Status: DC
  Administered 2019-06-07 – 2019-06-09 (×2): 2 via ORAL
  Filled 2019-06-07 (×2): qty 2

## 2019-06-07 MED ORDER — SODIUM CHLORIDE (PF) 0.9 % IJ SOLN
INTRAMUSCULAR | Status: DC | PRN
  Administered 2019-06-07: 12 mL/h via EPIDURAL

## 2019-06-07 MED ORDER — LACTATED RINGERS IV SOLN
500.0000 mL | INTRAVENOUS | Status: DC | PRN
  Administered 2019-06-07: 500 mL via INTRAVENOUS

## 2019-06-07 MED ORDER — OXYTOCIN 40 UNITS IN NORMAL SALINE INFUSION - SIMPLE MED
2.5000 [IU]/h | INTRAVENOUS | Status: DC
  Filled 2019-06-07: qty 1000

## 2019-06-07 MED ORDER — SIMETHICONE 80 MG PO CHEW: 80.0000 mg | CHEWABLE_TABLET | ORAL | Status: DC | PRN

## 2019-06-07 MED ORDER — LACTATED RINGERS IV SOLN
500.0000 mL | Freq: Once | INTRAVENOUS | Status: AC
  Administered 2019-06-07: 14:00:00 via INTRAVENOUS

## 2019-06-07 MED ORDER — PRENATAL MULTIVITAMIN CH
1.0000 | ORAL_TABLET | Freq: Every day | ORAL | Status: DC
  Filled 2019-06-07: qty 1

## 2019-06-07 NOTE — H&P (Addendum)
OBSTETRIC ADMISSION HISTORY AND PHYSICAL  Tanya Rodgers is a 23 y.o. female G2P1001 with IUP at 14w0dby 5 wk [place] due to IUGR. She reports +FMs, No LOF, no VB, no blurry vision, headaches or peripheral edema, and RUQ pain.  She plans on breast and bottle feeding. She request postpartum nexokplanon for birth control. [place]--->  Estimated Date of Delivery: 06/28/19  Sono:    _0 , Severe fetal growth restriction w/ a small cystic structure superior and anterior to the thalamus, cephalic presentation, 13762G <1% EFW   Prenatal History/Complications: IUGR ASC-H on pap Hx of VAVD due to poor maternal effort  Past Medical History: Past Medical History:  Diagnosis Date  . GBS bacteriuria 02/23/2016   Will need prophylaxis in labor  . Medical history non-contributory   . Supervision of normal first pregnancy, antepartum 02/14/2016    Clinic  [facility] Prenatal Labs Dating  7 week sono Blood type: O/Positive/-- (08/15 1531) O pos Genetic Screen 1 Screen: nl NT   AFP: neg     Antibody:Negative (08/15 1531)neg Anatomic [place] normal  Rubella: 1.96 (08/15 1531)Immune GTT  Third trimester: nl 2hr RPR: Non Reactive (08/15 1531) NR Flu vaccine  declined HBsAg: Negative (08/15 1531) NEg TDaP vaccine                                           Past Surgical History: Past Surgical History:  Procedure Laterality Date  . TONSILLECTOMY      Obstetrical History: OB History    Gravida  2   Para  1   Term  1   Preterm      AB      Living  1     SAB      TAB      Ectopic      Multiple      Live Births  1           Social History: Social History   Socioeconomic History  . Marital status: Single    Spouse name: Not on file  . Number of children: Not on file  . Years of education: Not on file  . Highest education level: Not on file  Occupational History  . Not on file  Social Needs  . Financial  resource strain: Not on file  . Food insecurity    Worry: Not on file    Inability: Not on file  . Transportation needs    Medical: Not on file    Non-medical: Not on file  Tobacco Use  . Smoking status: Never Smoker  . Smokeless tobacco: Never Used  Substance and Sexual Activity  . Alcohol use: No  . Drug use: No  . Sexual activity: Yes  Lifestyle  . Physical activity    Days per week: Not on file    Minutes per session: Not on file  . Stress: Not on file  Relationships  . Social [place]: Not on file    Gets together: Not on file    Attends religious service: Not on file    Active member of club or organization: Not on file    Attends meetings of clubs or organizations: Not on file    Relationship status: Not on file  Other Topics Concern  . Not on file  Social History Narrative  . Not on file    Family History: No family history on file.  Allergies: Allergies  Allergen Reactions  . Kiwi Extract Swelling  . Mushroom Extract Complex Swelling  . Pollen Extract Itching    Medications Prior to Admission  Medication Sig Dispense Refill Last Dose  . Blood Pressure KIT Monitor blood pressure at home regularly z34.90 standard size cuff 1 each 0   . cyclobenzaprine (FLEXERIL) 10 MG tablet Take 1 tablet (10 mg total) by mouth 2 (two) times daily as needed for muscle spasms. (Patient not taking: Reported on 06/03/2019) 20 tablet 0   . Elastic Bandages & Supports (COMFORT FIT MATERNITY SUPP SM) MISC 1 Units by Does not apply route daily as needed. 1 each 0   . Prenatal Vit-Fe Phos-FA-Omega (VITAFOL GUMMIES) 3.33-0.333-34.8 MG CHEW Chew 3 tablets by mouth daily. 90 tablet 11      Review of Systems   All systems reviewed and negative except as stated in HPI  Last menstrual period 08/22/2018, unknown if currently breastfeeding. General appearance: alert, cooperative, appears stated age and no distress Lungs: clear to auscultation bilaterally Heart:  regular rate and rhythm Abdomen: soft, non-tender; bowel sounds normal Pelvic: 1.5/20/ballotable Extremities: Homans sign is negative, no sign of DVT Presentation: cephalic Fetal monitoringBaseline: 120 bpm, Variability: Good {> 6 bpm), Accelerations: Non-reactive but appropriate for gestational age and Decelerations: Absent Uterine activityNone     Prenatal labs: ABO, Rh: O/Positive/-- (06/10 1349) Antibody: Negative (06/10 1349) Rubella: 2.07 (06/10 1349) RPR: Non Reactive (10/06 0937)  HBsAg: Negative (06/10 1349)  HIV: Non Reactive (10/06 0937)  GBS: --Henderson Cloud (12/01 0358)  1 hr Glucola 78, 107, 93. Passed. Genetic screening  Low risk Anatomy US: Severe fetal growth restriction w/ a small cystic structure superior and anterior to the thalamus  Prenatal Transfer Tool  Maternal Diabetes: No Genetic Screening: Normal Maternal Ultrasounds/Referrals: IUGR and Other: small cystic structure superior and anterior to the thalamus Fetal Ultrasounds or other Referrals:  Referred to Materal Fetal Medicine  Maternal Substance Abuse:  No Significant Maternal Medications:  None Significant Maternal Lab Results: Group B Strep negative  No results found for this or any previous visit (from the past 24 hour(s)).  Patient Active Problem List   Diagnosis Date Noted  . Round ligament pain 04/16/2019  . IUGR (intrauterine growth restriction) affecting care of mother 01/28/2019  . Atypical squamous cells cannot exclude high grade squamous intraepithelial lesion on cytologic smear of cervix (ASC-H) 12/24/2018  . Encounter for supervision of normal pregnancy, antepartum 12/10/2018    Assessment/Plan:  Tanya Rodgers is a 23 y.o. G2P1001 at 57w0dhere for IOL due to IUGR.  #Labor: Bishop score is 4. Placed FB w/o complication. Will start cervical ripening w/ cytotec 585m, repeat q4h prn. Progress to Pit as indicated. Anticipate vaginal delivery.  MFM recommends peds evaluate and [place]  after delivery to evaluate ventriculomegaly, will have NICU present at delivery. #Pain: IV fentanyl as needed, epidural upon request #FWB: continuous monitoring. #ID: GBS-. COVID -. #MOF: breast and bottle #MOC: Inpatient nexplanon #Circ:  Desires circ, will get outpt #Abnormal pap: Pap in June 2020 reveals ASC-H/ Will require colposcopy after delivery. #Social: Patient lives w/ boyfriend's mother. Does not have custody of her first child. Will need SW consult upon delivery.  KeDemetrius RevelMD  06/07/2019, 7:35 AM  I confirm that I have verified the information documented in the resident's  note and that I have also personally reperformed the history, physical exam and all medical decision making activities of this service and have verified that all service and findings are accurately documented in this student's note.   Wende Mott, [place] 06/07/2019 10:17 AM

## 2019-06-07 NOTE — Discharge Summary (Signed)
Postpartum Discharge Summary     Patient Name: Tanya Rodgers DOB: October 28, 1995 MRN: [id number]  Date of admission: 06/07/2019 Delivering Provider: Merilyn Baba   Date of discharge: 06/09/2019   Admitting diagnosis: Pregnancy Intrauterine pregnancy: [redacted]w[redacted]d    Secondary diagnosis:  Active Problems:   Labor and delivery, indication for care  Additional problems: IUGR <1%     Discharge diagnosis: Term Pregnancy Delivered                                                                                                Post partum procedures: Nexplanon  Augmentation: AROM, Pitocin, Cytotec and Foley Balloon  Complications: None  Hospital course:  Induction of Labor With Vaginal Delivery   23y.o. yo G2P1001 at 364w0das admitted to the hospital 06/07/2019 for induction of labor.  Indication for induction: IUGR <1%.  Patient had an uncomplicated labor course as follows:  She was admitted to labor and delivery. FB was placed and she was started on cytotec for 2 doses. She was then started on pitocin and AROMed. She progressed to complete within 2 hours and pushed to delivery.  Membrane Rupture Time/Date: 6:05 PM ,06/07/2019   Intrapartum Procedures: Episiotomy: None [1]                                         Lacerations:  None [1]  Patient had delivery of a Viable infant.  Information for the patient's newborn:  MaMicaela, Stith0[[id number]]Delivery Method: Vaginal, Spontaneous(Filed from Delivery Summary)    06/07/2019  Details of delivery can be found in separate delivery note.  Patient had a routine postpartum course. Patient is discharged home 06/09/19. Delivery time: 8:22 PM    Magnesium Sulfate received: No BMZ received: No Rhophylac:N/A MMR:N/A Transfusion:No  Physical exam  Vitals:   06/08/19 0800 06/08/19 1230 06/08/19 2242 06/09/19 0520  BP: 111/72 106/66 124/84 111/83  Pulse: 84 75 77 68  Resp: 18 18 17 18   Temp: 98.2 F (36.8 C) 98.3 F (36.8 C)  98 F (36.7 C) 98.3 F (36.8 C)  TempSrc: Oral Oral Oral Oral  SpO2: 100% 99% 100% 100%  Weight:      Height:       General: alert, cooperative and no distress Lochia: appropriate Uterine Fundus: firm Incision: N/A DVT Evaluation: No evidence of DVT seen on physical exam. Negative Homan's sign. No cords or calf tenderness. No significant calf/ankle edema. Labs: Lab Results  Component Value Date   WBC 22.0 (H) 06/08/2019   HGB 10.1 (L) 06/08/2019   HCT 31.1 (L) 06/08/2019   MCV 87.9 06/08/2019   PLT 227 06/08/2019   No flowsheet data found.  Discharge instruction: per After Visit Summary and "Baby and Me Booklet".  After visit meds:  Allergies as of 06/09/2019      Reactions   Kiwi Extract Swelling   Mushroom Extract Complex Swelling   Pollen Extract Itching      Medication List  TAKE these medications   acetaminophen 325 MG tablet Commonly known as: Tylenol Take 2 tablets (650 mg total) by mouth every 4 (four) hours as needed (for pain scale < 4).   Blood Pressure Kit Monitor blood pressure at home regularly z34.90 standard size cuff   Comfort Fit Maternity Supp Sm Misc 1 Units by Does not apply route daily as needed.   cyclobenzaprine 10 MG tablet Commonly known as: FLEXERIL Take 1 tablet (10 mg total) by mouth 2 (two) times daily as needed for muscle spasms.   ferrous sulfate 325 (65 FE) MG tablet Take 1 tablet (325 mg total) by mouth every other day. Start taking on: June 10, 2019   ibuprofen 600 MG tablet Commonly known as: ADVIL Take 1 tablet (600 mg total) by mouth every 6 (six) hours.   Vitafol Gummies 3.33-0.333-34.8 MG Chew Chew 3 tablets by mouth daily.       Diet: routine diet  Activity: Advance as tolerated. Pelvic rest for 6 weeks.   Outpatient follow up:4 weeks Follow up Appt: Future Appointments  Date Time Provider [place]  07/13/2019  1:00 PM Constant, Vickii Chafe, MD [place] None   Follow up Visit: Please schedule  this patient for Postpartum visit in: 4 weeks with the following provider: Any provider For C/S patients schedule nurse incision check in weeks 2 weeks: no High risk pregnancy complicated by: IUGR <6%, Hx VAVD Delivery mode:  vaginal Anticipated Birth Control:  Nexplanon PP Procedures needed: Colposcopy Schedule Integrated [place] visit: no  Newborn Data: Live born female  Birth Weight:  1919 g APGAR: 8, 9   Newborn Delivery   Birth date/time: 06/07/2019 20:22:00 Delivery type: Vaginal, Spontaneous      Baby Feeding: Bottle and Breast Disposition:home with mother   06/09/2019 Merilyn Baba, DO

## 2019-06-07 NOTE — Anesthesia Preprocedure Evaluation (Signed)
Anesthesia Evaluation  Patient identified by MRN, date of birth, ID band Patient awake    Reviewed: Allergy & Precautions, H&P , NPO status , Patient's Chart, lab work & pertinent test results  Airway Mallampati: I  TM Distance: >3 FB Neck ROM: full    Dental no notable dental hx. (+) Teeth Intact   Pulmonary neg pulmonary ROS,    Pulmonary exam normal breath sounds clear to auscultation       Cardiovascular negative cardio ROS Normal cardiovascular exam Rhythm:regular Rate:Normal     Neuro/Psych negative neurological ROS  negative psych ROS   GI/Hepatic negative GI ROS, Neg liver ROS,   Endo/Other  negative endocrine ROS  Renal/GU negative Renal ROS  negative genitourinary   Musculoskeletal negative musculoskeletal ROS (+)   Abdominal Normal abdominal exam  (+)   Peds  Hematology negative hematology ROS (+)   Anesthesia Other Findings   Reproductive/Obstetrics (+) Pregnancy                             Anesthesia Physical Anesthesia Plan  ASA: II  Anesthesia Plan: Epidural   Post-op Pain Management:    Induction:   PONV Risk Score and Plan:   Airway Management Planned:   Additional Equipment:   Intra-op Plan:   Post-operative Plan:   Informed Consent: I have reviewed the patients History and Physical, chart, labs and discussed the procedure including the risks, benefits and alternatives for the proposed anesthesia with the patient or authorized representative who has indicated his/her understanding and acceptance.       Plan Discussed with:   Anesthesia Plan Comments:         Anesthesia Quick Evaluation  

## 2019-06-07 NOTE — Progress Notes (Signed)
Labor Progress Note Tanya Rodgers is a 23 y.o. G2P1001 at [redacted]w[redacted]d presented for IOL due to IUGR. S: Contractions q3-4 mins, pain well controlled w/ epidural.  O:  BP 120/79   Pulse 99   Temp 98.2 F (36.8 C) (Oral)   Resp 18   Ht 5\' 1"  (1.549 m)   Wt 70.1 kg   LMP 08/22/2018   BMI 29.19 kg/m  EFM: 120/mod var/ no accels/decels  CVE: Dilation: 6 Effacement (%): 70 Cervical Position: Middle Station: -1 Presentation: Vertex Exam by:: J Mbugua RN   A&P: 23 y.o. G2P1001 [redacted]w[redacted]d IOL due to IUGR. #Labor: Progressing well. FB placed around 9AM. S/p cytotec x2 doses w/ last given 1341. Started [employer], currently running at 2. Will uptitrate as tolerated. S/p AROM w/ clear fluid at 1805. Anticipate vaginal delivery.  #Pain: epidural  #FWB: Cat 1 FHT, cont to monitor #GBS negative #Abnormal pap: Pap in June 2020 reveals ASC-H/ Will require colposcopy after delivery. #Social: Patient lives w/ boyfriend's mother. Does not have custody of her first child. Will need SW consult upon delivery.  Demetrius Revel, MD 6:07 PM

## 2019-06-07 NOTE — Progress Notes (Signed)
Labor Progress Note Tanya Rodgers is a 23 y.o. G2P1001 at [redacted]w[redacted]d presented for IOL due to IUGR. S: Her contractions are becoming more intense, q2-3 min. She did receive a dose of fentanyl but her pain was poorly controlled and she developed diffuse itching requiring benadryl. Requesting epidural.  O:  BP (!) 114/57   Pulse 91   Temp 98.3 F (36.8 C) (Oral)   Resp 18   Ht 5\' 1"  (1.549 m)   Wt 70.1 kg   LMP 08/22/2018   BMI 29.19 kg/m  EFM: 110/moderate variability/no accel, no decels  CVE: Dilation: 1.5 Effacement (%): Thick Station: Ballotable Presentation: Vertex Exam by:: Dr Lia Foyer    A&P: 23 y.o. G2P1001 [redacted]w[redacted]d here for IOL due to IUGR. #Labor: Progressing well. FB placed around 9AM. S/p 1 dose cytotec, will redose now. Anticipate vaginal delivery.  #Pain: get epidural now  #FWB: Cat 1 FHT, cont to monitor #GBS negative #Abnormal pap: Pap in June 2020 reveals ASC-H/ Will require colposcopy after delivery. #Social: Patient lives w/ boyfriend's mother. Does not have custody of her first child. Will need SW consult upon delivery.   Demetrius Revel, MD 1:49 PM

## 2019-06-07 NOTE — Anesthesia Procedure Notes (Signed)
Epidural Patient location during procedure: OB Start time: 06/07/2019 2:09 PM End time: 06/07/2019 2:11 PM  Staffing Anesthesiologist: Lyn Hollingshead, MD Performed: anesthesiologist   Preanesthetic Checklist Completed: patient identified, site marked, surgical consent, pre-op evaluation, timeout performed, IV checked, risks and benefits discussed and monitors and equipment checked  Epidural Patient position: sitting Prep: site prepped and draped and DuraPrep Patient monitoring: continuous pulse ox and blood pressure Approach: midline Location: L3-L4 Injection technique: LOR air  Needle:  Needle type: Tuohy  Needle gauge: 17 G Needle length: 9 cm and 9 Needle insertion depth: 6 cm Catheter type: closed end flexible Catheter size: 19 Gauge Catheter at skin depth: 11 cm Test dose: negative and Other  Assessment Events: blood not aspirated, injection not painful, no injection resistance, negative IV test and no paresthesia  Additional Notes Reason for block:procedure for pain

## 2019-06-08 DIAGNOSIS — Z30017 Encounter for initial prescription of implantable subdermal contraceptive: Secondary | ICD-10-CM

## 2019-06-08 LAB — CBC
HCT: 31.1 % — ABNORMAL LOW (ref 36.0–46.0)
Hemoglobin: 10.1 g/dL — ABNORMAL LOW (ref 12.0–15.0)
MCH: 28.5 pg (ref 26.0–34.0)
MCHC: 32.5 g/dL (ref 30.0–36.0)
MCV: 87.9 fL (ref 80.0–100.0)
Platelets: 227 10*3/uL (ref 150–400)
RBC: 3.54 MIL/uL — ABNORMAL LOW (ref 3.87–5.11)
RDW: 14 % (ref 11.5–15.5)
WBC: 22 10*3/uL — ABNORMAL HIGH (ref 4.0–10.5)
nRBC: 0 % (ref 0.0–0.2)

## 2019-06-08 MED ORDER — COMPLETENATE 29-1 MG PO CHEW
1.0000 | CHEWABLE_TABLET | Freq: Every day | ORAL | Status: DC
  Administered 2019-06-08 – 2019-06-09 (×2): 1 via ORAL
  Filled 2019-06-08 (×2): qty 1

## 2019-06-08 MED ORDER — FERROUS SULFATE 325 (65 FE) MG PO TABS
325.0000 mg | ORAL_TABLET | ORAL | Status: DC
  Administered 2019-06-08: 10:00:00 325 mg via ORAL
  Filled 2019-06-08: qty 1

## 2019-06-08 MED ORDER — ETONOGESTREL 68 MG ~~LOC~~ IMPL
68.0000 mg | DRUG_IMPLANT | Freq: Once | SUBCUTANEOUS | Status: AC
  Administered 2019-06-08: 12:00:00 68 mg via SUBCUTANEOUS
  Filled 2019-06-08: qty 1

## 2019-06-08 MED ORDER — LIDOCAINE HCL 1 % IJ SOLN
0.0000 mL | Freq: Once | INTRAMUSCULAR | Status: AC | PRN
  Administered 2019-06-08: 12:00:00 20 mL via INTRADERMAL
  Filled 2019-06-08: qty 20

## 2019-06-08 NOTE — Lactation Note (Addendum)
This note was copied from a baby's chart. Lactation Consultation Note Baby 7 hrs old. Baby in crib when [place] entered rm. While talking w/mom noted baby smacking his lips. Mom stated that they BF only for a few minutes and tried to give him a little formula. Mom stated the baby latched and fed well after delivery. Discussed w/mom need to pump. Mom agrees to pump.  Mom shown how to use DEBP & how to disassemble, clean, & reassemble parts.Mom knows to pump q3h for 15-20 min. Mom encouraged to feed baby 8-12 times/24 hours and with feeding cues.  LPI information sheet given and reviewed. Supplementing, behavior of 4 lb baby discussed. Encouraged STS but cover baby w/blankets of what is not STS w/mom.  Mom used DEBP while LC done a lot of teaching.  Collected 26ml colostrum. Baby cueing so LC asked mom if we can try to BF mom stated yes. Baby placed STS to mom, covered w/blankets. Baby wouldn't latch. Baby appears to be a little floppy. Skin slightly cool. t-shirt put on baby for top and bottom to cover feet. Wrapped in 2 blankets. Baby arousable. Gave 8 ml colostrum w/slow flow nipple. Asked tech. To take baby's temp. 97.0 reported to RN. LC placed baby STS and covered. RN wanted baby to go to CN under warmer. 7 ID bracelet ID w/mom. Reported to RN of status. Tech took baby to warmer. Sent [facility] referral.  Patient Name: Tanya Rodgers DTOIZ'T Date: 06/08/2019 Reason for consult: Initial assessment;1st time breastfeeding;Infant < 6lbs;Early term 37-38.6wks   Maternal Data Has patient been taught Hand Expression?: Yes Does the patient have breastfeeding experience prior to this delivery?: Yes  Feeding Feeding Type: Breast Milk Nipple Type: Slow - flow  LATCH Score Latch: Too sleepy or reluctant, no latch achieved, no sucking elicited.  Audible Swallowing: None  Type of Nipple: Everted at rest and after stimulation  Comfort (Breast/Nipple): Soft / non-tender  Hold  (Positioning): Full assist, staff holds infant at breast  LATCH Score: 4  Interventions Interventions: Breast feeding basics reviewed;Adjust position;DEBP;Assisted with latch;Support pillows;Skin to skin;Position options;Breast massage;Expressed milk;Hand express;Breast compression  Lactation Tools Discussed/Used Tools: Pump Breast pump type: Double-Electric Breast Pump WIC Program: Yes Pump Review: Setup, frequency, and cleaning;Milk Storage Initiated by:: Allayne Stack RN IBCLC Date initiated:: 06/08/19   Consult Status Consult Status: Follow-up Date: 06/08/19(in pm) Follow-up type: In-patient    Theodoro Kalata 06/08/2019, 4:18 AM

## 2019-06-08 NOTE — Procedures (Signed)
GYNECOLOGY PROCEDURE NOTE  Tanya Rodgers is a 23 y.o. [id number] requesting Nexplanon insertion. No gynecologic concerns.  Nexplanon Insertion Procedure Patient identified, informed consent performed, consent signed. Patient does understand that irregular bleeding is a very common side effect of this medication. She was advised to have backup contraception for one week after placement. Appropriate time out taken. Patient's left arm was prepped and draped in the usual sterile fashion. The insertion area was measured and marked. Patient was prepped with alcohol swab and then injected with 3 ml of 1% lidocaine. The area was then prepped with betadine. Nexplanon removed from packaging and device confirmed present within needle, then inserted full length of needle and withdrawn per handbook instructions. Nexplanon was able to palpated in the patient's arm; patient palpated the insert herself. There was minimal blood loss. Patient insertion site covered with steri strip, guaze, and a pressure bandage to reduce any bruising. The patient tolerated the procedure well and was given post procedure instructions.

## 2019-06-08 NOTE — Progress Notes (Addendum)
Patient ID: Tanya Rodgers, female   DOB: 1995-11-05, 23 y.o.   MRN: [id number]  Post Partum Day 1 Subjective: Ms. Tanya Rodgers is a 23yo [phone] with hx of VAVD who is PPD1 s/p VD at [place] following IOL for IUGR <1%. She states that she is feeling well. Has been eating, drinking, ambulating, and voiding. She is passing gas. Tanya Rodgers is described as "less than normal period."   Objective: Blood pressure 112/74, pulse 73, temperature 98 F (36.7 C), temperature source Oral, resp. rate 18, height 5\' 1"  (1.549 m), weight 70.1 kg, last menstrual period 08/22/2018, SpO2 100 %, unknown if currently breastfeeding.  Physical Exam:  General: alert, cooperative, sitting up in bed Lochia: appropriate Uterine Fundus: firm at the level of the umbilicus Incision: none DVT Evaluation: No LE edema, erythema, pain. Negative Homan's sign  Recent Labs    06/07/19 0830 06/08/19 0538  HGB 11.0* 10.1*  HCT 34.6* 31.1*    Assessment/Plan: Ms. Tanya Rodgers is a 23yo K2H0623 who is PPD1 s/p VD at [place] following IOL for IUGR <1% and is recovering well. - Stay another day with possibly anticipated discharge tomorrow. Seen by lactation. - Initiate iron - Pending SW consult - Nexplanon to be placed here - Circumcision in outpatient setting - Colposcopy for ASC-H at outpatient PP appointment   LOS: 1 day   Tanya Rodgers 06/08/2019, 8:37 AM

## 2019-06-08 NOTE — Anesthesia Postprocedure Evaluation (Signed)
Anesthesia Post Note  Patient: Tanya Rodgers  Procedure(s) Performed: AN AD HOC LABOR EPIDURAL     Patient location during evaluation: Mother Baby Anesthesia Type: Epidural Level of consciousness: awake and alert Pain management: pain level controlled Vital Signs Assessment: post-procedure vital signs reviewed and stable Respiratory status: spontaneous breathing, nonlabored ventilation and respiratory function stable Cardiovascular status: stable Postop Assessment: no headache, no backache, epidural receding, no apparent nausea or vomiting, patient able to bend at knees, adequate PO intake and able to ambulate Anesthetic complications: no    Last Vitals:  Vitals:   06/07/19 2350 06/08/19 0417  BP: 122/73 112/74  Pulse: 72 73  Resp: 17 18  Temp: 36.7 C 36.7 C  SpO2: 100% 100%    Last Pain:  Vitals:   06/08/19 0417  TempSrc: Oral  PainSc: 0-No pain   Pain Goal:                   Talitha Givens

## 2019-06-08 NOTE — Lactation Note (Signed)
This note was copied from a baby's chart. Lactation Consultation Note  Patient Name: Tanya Rodgers [id number] Date: 06/08/2019 Reason for consult: Follow-up assessment(per mom baby in the nursery under the warmer due to low temp and the nurse will feed the baby)  Per mom last pumped at 12 N and mom showed the 3-4 ml she had pumped.  Mom mentioned she felt comfortable with hand expressing and the #24 F she is using to pump with. Since the baby is in the nursery under the warmer and its  been 4 hours since mom pumped. LC washed the pump pieces and encouraged mom to pump both breast for stimulation.     Maternal Data Has patient been taught Hand Expression?: Yes  Feeding Feeding Type: Bottle Fed - Formula Nipple Type: Slow - flow  LATCH Score                   Interventions Interventions: Breast feeding basics reviewed  Lactation Tools Discussed/Used Tools: Pump Breast pump type: Double-Electric Breast Pump WIC Program: Yes(11-7 pm LC sent a WIC referral this am for a DEBP) Pump Review: Setup, frequency, and cleaning(LC washed the pump pieces for mom)   Consult Status Consult Status: Follow-up Date: 06/09/19 Follow-up type: In-patient    [place] 06/08/2019, 3:56 PM

## 2019-06-08 NOTE — Clinical Social Work Maternal (Signed)
CLINICAL SOCIAL WORK MATERNAL/CHILD NOTE  Patient Details  Name: Tanya Rodgers MRN: [id number] Date of Birth: 1996-05-30  Date:  06/08/2019  Clinical Social Worker Initiating Note:  Elijio Miles Date/Time: Initiated:  06/08/19/0914     Child's Name:  Tanya Rodgers (MOB unsure of spelling of last name but it will be FOB's last name)   Biological Parents:  Mother, Father(Tanya Rodgers and Tanya Rodgers (MOB unsure of FOB's spelling as it is complex))   Need for Interpreter:  None   Reason for Referral:  Other (Comment)(Received consult for MOB not having custody of her oldest child)   Address:  [address], [place], [place] [address]   Phone number:  [phone] (home)     Additional phone number:   Household Members/Support Persons (HM/SP):   Household Member/Support Person 1, Household Member/Support Person 2, Household Member/Support Person 3, Household Member/Support Person 4   HM/SP Name Relationship DOB or Age  HM/SP -1 Tanya Rodgers MGM    HM/SP -2   FOB    HM/SP -3   FOB's sister    HM/SP -[place] Daughter (currently lives with [place]) 09/04/2016  HM/SP -5        HM/SP -6        HM/SP -7        HM/SP -8          Natural Supports (not living in the home):  Immediate Family, Extended Family, Parent   Professional Supports: None   Employment: Unemployed   Type of Work:     Education:  Programmer, systems   Homebound arranged:    Museum/gallery curator Resources:  [employer]   Other Resources:  Physicist, medical    Cultural/Religious Considerations Which May Impact Care:    Strengths:  Ability to meet basic needs , Home prepared for child , Pediatrician chosen   Psychotropic Medications:         Pediatrician:    [place]  Pediatrician List:   [place]  [place]      Pediatrician Fax Number:    Risk Factors/Current Problems:       Cognitive State:  Alert , Able to Concentrate    Mood/Affect:  Calm , Comfortable    CSW Assessment:  CSW received consult for "does not have custody of first child".  CSW met with MOB to offer support and complete assessment.    MOB resting in bed with infant asleep in bassinet, when CSW entered the room. MOB welcoming of CSW visit and was pleasant and engaged throughout assessment. Per MOB, she currently lives with FOB's mother (Tanya Rodgers), FOB and FOB's sister in [place]. MOB shared she has a 34-year-old daughter who is currently staying with MOB's mother Tanya Rodgers) in [place]. CSW inquired about custody of child and MOB stated she still has custody of child but that child lives with MGM as "child goes back and forth between [facility] and child's father". CSW asked MOB to explain further and MOB stated that while she was pregnant she didn't have access to transportation so she asked if her mother would watch 74-year-old to help with transportation. MOB stated she intends to have child move back in with her once settled. CSW confirmed again with MOB that she still has custody of child and MOB stated she and her child's father have joint custody. MOB reported  she does not currently work and confirmed she receives food stamps and is aware she needs to inform them of her delivery. CSW inquired about MOB's mental health history and MOB denied having any and denied any previous PPD/PPA. CSW provided education regarding the baby blues period vs. perinatal mood disorders, discussed treatment and gave resources for mental health follow up if concerns arise.  CSW recommends self-evaluation during the postpartum time period using the New Mom Checklist from Postpartum Progress and encouraged MOB to contact a medical professional if symptoms are noted at any time. MOB did not appear to be displaying any acute mental health symptoms and denied any current SI, HI or DV. MOB reported having good  support from FOB's mother, MOB's father, MOB's step-father and MOB's step-mother.   MOB confirmed having all essential items for infant once discharged. CSW inquired about where MOB will have infant sleep once discharged and MOB stated with FOB's mother. CSW asked for clarification and MOB stated infant would be sleeping in the bed with FOB's mother until MOB is able to get a bassinet. CSW provided Safe Sleep Education and stated co-sleeping is strongly discouraged and shared risks associated with co-sleeping. CSW offered to provide MOB with Baby Box and MOB appreciative of this. MOB provided with Baby Box to use after discharge.   CSW Plan/Description:  No Further Intervention Required/No Barriers to Discharge, Sudden Infant Death Syndrome (SIDS) Education, Perinatal Mood and Anxiety Disorder (PMADs) Education, Other Information/Referral to [employer], [place] 06/08/2019, 10:35 AM

## 2019-06-09 LAB — SURGICAL PATHOLOGY

## 2019-06-09 MED ORDER — ACETAMINOPHEN 325 MG PO TABS: 650.0000 mg | ORAL_TABLET | ORAL | 0 refills | Status: AC | PRN

## 2019-06-09 MED ORDER — FERROUS SULFATE 325 (65 FE) MG PO TABS: 325.0000 mg | ORAL_TABLET | ORAL | 3 refills | Status: DC

## 2019-06-09 MED ORDER — IBUPROFEN 600 MG PO TABS: 600.0000 mg | ORAL_TABLET | Freq: Four times a day (QID) | ORAL | 0 refills | Status: AC

## 2019-06-09 NOTE — Lactation Note (Signed)
This note was copied from a baby's chart. Lactation Consultation Note  Patient Name: Tanya Rodgers [id number] Date: 06/09/2019 Reason for consult: Follow-up assessment;Early term 38-38.6wks Baby is 71 hours old / 3 % weight loss/  As LC entered the room mom holding baby and mentioned the baby  Last fed at 12:30 13 ML. Per mom pumped once since yesterday and  Left the milk stay out to long  from the refrigerator and had to throw it out.  LC reviewed supply and demand / benefits of STS , latching and pumping to  Provided breast milk for her baby. LC reminded mom that at one point she had pumped off 8 ml and her baby received her milk. Praised her for her pumping, encouraged her to continue to pump and feed EBM back to baby, and call with feeding cues for latch assist.  LC sent the [place] referral yesterday to [place] and per mom has not heard from [facility] as of yet.    Maternal Data    Feeding Feeding Type: (baby recently fed from a bottle and took 13 ml) Nipple Type: Slow - flow  LATCH Score                   Interventions Interventions: Breast feeding basics reviewed  Lactation Tools Discussed/Used Tools: Pump Breast pump type: Double-Electric Breast Pump Pump Review: Milk Storage   Consult Status Consult Status: Follow-up Date: 06/09/19 Follow-up type: In-patient    [place] 06/09/2019, 2:01 PM

## 2019-06-09 NOTE — Discharge Instructions (Signed)

## 2019-06-10 ENCOUNTER — Ambulatory Visit: Payer: Self-pay

## 2019-06-10 NOTE — Lactation Note (Signed)
This note was copied from a baby's chart. Lactation Consultation Note  Patient Name: Tanya Rodgers [id number] Date: 06/10/2019 Reason for consult: Follow-up assessment;Infant < 6lbs;Early term 21-38.6wks   Baby 65 hours old.  37 weeks. < 5 lbs. Mother recently pumped 10 ml and stated baby would only take 5 ml and an additional 5 ml of formula. Unwrapped baby and undressed to waist to show mother how to wake and hold baby for feeding.  Reviewed some feeding positions.  Betsy RN reiterated feeding schedule and volume.   Mother continued with feeding.  Praised her for her efforts. Reminded mother to pump at least q 3 hours.      Maternal Data    Feeding Feeding Type: Bottle Fed - Breast Milk Nipple Type: Slow - flow  LATCH Score                   Interventions Interventions: DEBP  Lactation Tools Discussed/Used     Consult Status Consult Status: Follow-up Date: 06/11/19 Follow-up type: In-patient    Vivianne Master [facility] 06/10/2019, 1:55 PM

## 2019-06-11 ENCOUNTER — Ambulatory Visit: Payer: Self-pay

## 2019-06-11 NOTE — Lactation Note (Signed)
This note was copied from a baby's chart. Lactation Consultation Note [name]. Mom's milk is coming in. Mom stated she pumped a full bottle. Which is 90 ml. There was 42 ml left in bottle. Encouraged mom to divide milk into bottle d/t baby can't drink 90 ml.s just have amount of milk baby needs to drink in a bottle. Milk isn't very good about keeping up with I&O. Explained importance of strict I&O d/t small size of baby. Baby wt. 4.0 lbs. Praised mom for pumping and having so much milk. Reminded of milk storage. Discussed engorgement, management, pumping and breast massage. Encouraged to call for assistance.  Patient Name: Tanya Rodgers [id number] Date: 06/11/2019 Reason for consult: Follow-up assessment;Infant < 6lbs;Early term 37-38.6wks   Maternal Data Has patient been taught Hand Expression?: Yes  Feeding    LATCH Score       Type of Nipple: Everted at rest and after stimulation  Comfort (Breast/Nipple): Filling, red/small blisters or bruises, mild/mod discomfort(milk coming in.)        Interventions Interventions: DEBP  Lactation Tools Discussed/Used Breast pump type: Double-Electric Breast Pump   Consult Status Consult Status: Follow-up Date: 06/12/19 Follow-up type: In-patient    Theodoro Kalata 06/11/2019, 3:53 AM

## 2019-06-11 NOTE — Lactation Note (Signed)
This note was copied from a baby's chart. Lactation Consultation Note  Patient Name: Tanya Rodgers [id number]'[id number] Date: 06/11/2019  Mom being d/c today and asked to be seen at baby's bedside.  However attempt to see mom and she is not here. RN reports she came there right after d/c and then went home. Let RN know that attempted to see mom and she wasn't there.  Rn to call if mom wants to be seen when she returns.   Maternal Data    Feeding Feeding Type: Formula  LATCH Score                   Interventions    Lactation Tools Discussed/Used     Consult Status      Lailee Hoelzel Thompson [place] 06/11/2019, 4:11 PM

## 2019-06-14 ENCOUNTER — Ambulatory Visit: Payer: Self-pay

## 2019-06-14 NOTE — Lactation Note (Signed)
This note was copied from a baby's chart. Lactation Consultation Note  Patient Name: Tanya Rodgers EBXID'[id number] Date: 06/14/2019 Reason for consult: NICU baby  I walked into room & noted that Mom was pumping with an Even-Flo hand pump. On closer inspection, the flange she was using was a size 30.5 & it was too big for Mom. Mom did not have access to any other flanges for her hand pump.  I went & got Mom a Harmony hand pump. I briefly observed her pumping with the size 24, but felt it was becoming snug. I switched her to a size 27 flange with good results & Mom felt comfortable.  Mom pumps 4 times/day & 6 oz/session. It seems that she has been waiting until her breasts get really firm before she pumps. I encouraged her to pump 6-8 times/day if she wanted to make more milk. Mom said she would, as she "enjoys pumping."   I also brought her supplies to wash her pump parts while she is visiting her son & explained how to wash parts.    Matthias Hughs [facility] 06/14/2019, 10:44 AM

## 2019-06-18 ENCOUNTER — Ambulatory Visit: Payer: Self-pay

## 2019-06-18 NOTE — Lactation Note (Signed)
This note was copied from a baby's chart. Lactation Consultation Note  Patient Name: Tanya Rodgers [id number] Date: 06/18/2019   Asked by previous LC to assess mom's status with using hand pump. Mom absent at this time.  Cranston Neighbor 06/18/2019, 5:47 PM

## 2019-06-23 ENCOUNTER — Encounter (HOSPITAL_COMMUNITY): Payer: Self-pay | Admitting: Obstetrics and Gynecology

## 2019-06-23 ENCOUNTER — Inpatient Hospital Stay (HOSPITAL_COMMUNITY)
Admission: AD | Admit: 2019-06-23 | Discharge: 2019-06-23 | Disposition: A | Discharge status: Home / Self Care | Payer: Medicaid Other | Attending: Obstetrics and Gynecology | Admitting: Obstetrics and Gynecology

## 2019-06-23 ENCOUNTER — Other Ambulatory Visit: Payer: Self-pay

## 2019-06-23 ENCOUNTER — Inpatient Hospital Stay (HOSPITAL_COMMUNITY): Payer: Medicaid Other

## 2019-06-23 DIAGNOSIS — N939 Abnormal uterine and vaginal bleeding, unspecified: Secondary | ICD-10-CM | POA: Diagnosis present

## 2019-06-23 LAB — CBC WITH DIFFERENTIAL/PLATELET
Abs Immature Granulocytes: 0.03 10*3/uL (ref 0.00–0.07)
Basophils Absolute: 0.1 10*3/uL (ref 0.0–0.1)
Basophils Relative: 1 %
Eosinophils Absolute: 0.1 10*3/uL (ref 0.0–0.5)
Eosinophils Relative: 2 %
HCT: 40.1 % (ref 36.0–46.0)
Hemoglobin: 12.6 g/dL (ref 12.0–15.0)
Immature Granulocytes: 0 %
Lymphocytes Relative: 34 %
Lymphs Abs: 2.3 10*3/uL (ref 0.7–4.0)
MCH: 27.3 pg (ref 26.0–34.0)
MCHC: 31.4 g/dL (ref 30.0–36.0)
MCV: 87 fL (ref 80.0–100.0)
Monocytes Absolute: 0.5 10*3/uL (ref 0.1–1.0)
Monocytes Relative: 7 %
Neutro Abs: 3.9 10*3/uL (ref 1.7–7.7)
Neutrophils Relative %: 56 %
Platelets: 392 10*3/uL (ref 150–400)
RBC: 4.61 MIL/uL (ref 3.87–5.11)
RDW: 14.3 % (ref 11.5–15.5)
WBC: 7 10*3/uL (ref 4.0–10.5)
nRBC: 0 % (ref 0.0–0.2)

## 2019-06-23 NOTE — Discharge Instructions (Signed)

## 2019-06-23 NOTE — MAU Note (Signed)
Pt presents to MAU with c/o vaginal bleeding following an uncomplicated vaginal delivery on 12/06. She reports that the bleeding has been heavy since the delivery sometimes soaking through 2 pads an hour. She has seen quarter sized clots. Pt denies pain.

## 2019-06-23 NOTE — MAU Provider Note (Signed)
Chief Complaint: Vaginal Bleeding   First Provider Initiated Contact with Patient 06/23/19 1539     SUBJECTIVE HPI: Tanya Rodgers is a 23 y.o. G2P2002 at 2 weeks postpartum who presents to Maternity Admissions reporting vaginal bleeding. She had an SVD on 12/6. Denies complications with her delivery other than her baby went to NICU for IUGR.  Has had heavy bleeding since delivery. Reports filling up pads that she has to change every time she goes to the bathroom. Also reports half dollar sized blood clots that she passes every time she sits on the toilet. Denies abdominal pain or fever/chills.   Past Medical History:  Diagnosis Date  . GBS bacteriuria 02/23/2016   Will need prophylaxis in labor  . Medical history non-contributory   . Supervision of normal first pregnancy, antepartum 02/14/2016    Clinic  [facility] Prenatal Labs Dating  7 week sono Blood type: O/Positive/-- (08/15 1531) O pos Genetic Screen 1 Screen: nl NT   AFP: neg     Antibody:Negative (08/15 1531)neg Anatomic [place]  normal  Rubella: 1.96 (08/15 1531)Immune GTT  Third trimester: nl 2hr RPR: Non Reactive (08/15 1531) NR Flu vaccine  declined HBsAg: Negative (08/15 1531) NEg TDaP vaccine                                          OB History  Gravida Para Term Preterm AB Living  _0 SAB TAB Ectopic Multiple Live Births        0 2    # Outcome Date GA Lbr Len/2nd Weight Sex Delivery Anes PTL Lv  2 Term 06/07/19 10w0d12:16 / 00:06 1919 g M Vag-Spont EPI  LIV     Birth Comments: WNL  1 Term 09/04/16 346w6d F Vag-Vacuum EPI  LIV   Past Surgical History:  Procedure Laterality Date  . TONSILLECTOMY     Social History   Socioeconomic History  . Marital status: Single    Spouse name: Not on file  . Number of children: Not on file  . Years of education: Not on file  . Highest education level: Not on file  Occupational History  . Not on file  Tobacco Use  . Smoking status: Never Smoker  . Smokeless tobacco:  Never Used  Substance and Sexual Activity  . Alcohol use: No  . Drug use: No  . Sexual activity: Yes  Other Topics Concern  . Not on file  Social History Narrative  . Not on file   Social Determinants of Health   Financial Resource Strain:   . Difficulty of Paying Living Expenses: Not on file  Food Insecurity:   . Worried About RuCharity fundraisern the Last Year: Not on file  . Ran Out of Food in the Last Year: Not on file  Transportation Needs:   . Lack of Transportation (Medical): Not on file  . Lack of Transportation (Non-Medical): Not on file  Physical Activity:   . Days of Exercise per Week: Not on file  . Minutes of Exercise per Session: Not on file  Stress:   . Feeling of Stress : Not on file  Social Connections:   . Frequency of Communication with Friends and Family: Not on file  . Frequency of Social Gatherings with Friends and Family: Not on file  . Attends Religious Services:  Not on file  . Active Member of Clubs or Organizations: Not on file  . Attends Archivist Meetings: Not on file  . Marital Status: Not on file  Intimate Partner Violence:   . Fear of Current or Ex-Partner: Not on file  . Emotionally Abused: Not on file  . Physically Abused: Not on file  . Sexually Abused: Not on file   History reviewed. No pertinent family history. No current facility-administered medications on file prior to encounter.   Current Outpatient Medications on File Prior to Encounter  Medication Sig Dispense Refill  . acetaminophen (TYLENOL) 325 MG tablet Take 2 tablets (650 mg total) by mouth every 4 (four) hours as needed (for pain scale < 4). 30 tablet 0  . Blood Pressure KIT Monitor blood pressure at home regularly z34.90 standard size cuff 1 each 0  . cyclobenzaprine (FLEXERIL) 10 MG tablet Take 1 tablet (10 mg total) by mouth 2 (two) times daily as needed for muscle spasms. (Patient not taking: Reported on 06/03/2019) 20 tablet 0  . Elastic Bandages & Supports  (COMFORT FIT MATERNITY SUPP SM) MISC 1 Units by Does not apply route daily as needed. 1 each 0  . ferrous sulfate 325 (65 FE) MG tablet Take 1 tablet (325 mg total) by mouth every other day. 30 tablet 3  . ibuprofen (ADVIL) 600 MG tablet Take 1 tablet (600 mg total) by mouth every 6 (six) hours. 30 tablet 0  . Prenatal Vit-Fe Phos-FA-Omega (VITAFOL GUMMIES) 3.33-0.333-34.8 MG CHEW Chew 3 tablets by mouth daily. 90 tablet 11   Allergies  Allergen Reactions  . Kiwi Extract Swelling  . Mushroom Extract Complex Swelling  . Pollen Extract Itching    I have reviewed patient's Past Medical Hx, Surgical Hx, Family Hx, Social Hx, medications and allergies.   Review of Systems  Constitutional: Negative.   Gastrointestinal: Negative.   Genitourinary: Positive for vaginal bleeding.    OBJECTIVE Patient Vitals for the past 24 hrs:  BP Temp Temp src Pulse Resp SpO2  06/23/19 1712 (!) 114/56 -- -- -- -- --  06/23/19 1528 113/66 98.3 F (36.8 C) Oral 86 18 100 %   Constitutional: Well-developed, well-nourished female in no acute distress.  Cardiovascular: normal rate & rhythm, no murmur Respiratory: normal rate and effort. Lung sounds clear throughout GI: Abd soft, non-tender, Pos BS x 4. No guarding or rebound tenderness MS: Extremities nontender, no edema, normal ROM Neurologic: Alert and oriented x 4.  GU:     SPECULUM EXAM: NEFG, minimal amount of pink/red mucoid blood    LAB RESULTS Results for orders placed or performed during the hospital encounter of 06/23/19 (from the past 24 hour(s))  CBC with Differential     Status: None   Collection Time: 06/23/19  3:54 PM  Result Value Ref Range   WBC 7.0 4.0 - 10.5 K/uL   RBC 4.61 3.87 - 5.11 MIL/uL   Hemoglobin 12.6 12.0 - 15.0 g/dL   HCT 40.1 36.0 - 46.0 %   MCV 87.0 80.0 - 100.0 fL   MCH 27.3 26.0 - 34.0 pg   MCHC 31.4 30.0 - 36.0 g/dL   RDW 14.3 11.5 - 15.5 %   Platelets 392 150 - 400 K/uL   nRBC 0.0 0.0 - 0.2 %   Neutrophils  Relative % 56 %   Neutro Abs 3.9 1.7 - 7.7 K/uL   Lymphocytes Relative 34 %   Lymphs Abs 2.3 0.7 - 4.0 K/uL   Monocytes Relative  7 %   Monocytes Absolute 0.5 0.1 - 1.0 K/uL   Eosinophils Relative 2 %   Eosinophils Absolute 0.1 0.0 - 0.5 K/uL   Basophils Relative 1 %   Basophils Absolute 0.1 0.0 - 0.1 K/uL   Immature Granulocytes 0 %   Abs Immature Granulocytes 0.03 0.00 - 0.07 K/uL    IMAGING US Pelvis Complete  Result Date: 06/23/2019 CLINICAL DATA:  Postpartum bleeding EXAM: TRANSABDOMINAL ULTRASOUND OF PELVIS TECHNIQUE: Transabdominal ultrasound examination of the pelvis was performed including evaluation of the uterus, ovaries, adnexal regions, and pelvic cul-de-sac. COMPARISON:  None FINDINGS: Uterus Measurements: 10.2 x 6.2 x 7.3 cm = volume: 239 mL. Mildly enlarged and heterogeneous consistent with postpartum state. No focal uterine mass Endometrium Thickness: 9 mm. No endometrial fluid, mass, or products of conception identified. Right ovary Measurements: 3.8 x 1.7 x 1.4 cm = volume: 4.6 mL. Normal morphology without mass Left ovary Measurements: 2.8 x 1.2 x 1.7 cm = volume: 3.0 mL. Normal morphology without mass Other findings:  No free pelvic fluid or adnexal masses. IMPRESSION: Unremarkable postpartum uterus. No pelvic sonographic abnormalities identified. Electronically Signed   By: Lavonia Dana M.D.   On: 06/23/2019 16:58    MAU COURSE Orders Placed This Encounter  Procedures  . US Pelvis Complete  . CBC with Differential  . Discharge patient   No orders of the defined types were placed in this encounter.   MDM Vital signs stable.  CBC -- no leukocytosis & hemoglobin normal Minimal bleeding on exam Ultrasound normal for PP period; no evidence of retained pocs  ASSESSMENT 1. Postpartum bleeding     PLAN Discharge home in stable condition. Discussed reasons to return to MAU Keep PP f/u with OB  Allergies as of 06/23/2019      Reactions   Kiwi Extract Swelling    Mushroom Extract Complex Swelling   Pollen Extract Itching      Medication List    STOP taking these medications   cyclobenzaprine 10 MG tablet Commonly known as: FLEXERIL     TAKE these medications   acetaminophen 325 MG tablet Commonly known as: Tylenol Take 2 tablets (650 mg total) by mouth every 4 (four) hours as needed (for pain scale < 4).   Blood Pressure Kit Monitor blood pressure at home regularly z34.90 standard size cuff   Comfort Fit Maternity Supp Sm Misc 1 Units by Does not apply route daily as needed.   ferrous sulfate 325 (65 FE) MG tablet Take 1 tablet (325 mg total) by mouth every other day.   ibuprofen 600 MG tablet Commonly known as: ADVIL Take 1 tablet (600 mg total) by mouth every 6 (six) hours.   Vitafol Gummies 3.33-0.333-34.8 MG Chew Chew 3 tablets by mouth daily.        Jorje Guild, NP 06/23/2019  5:16 PM

## 2019-06-28 ENCOUNTER — Inpatient Hospital Stay: Admit: 2019-06-28 | Payer: Self-pay

## 2019-07-11 ENCOUNTER — Ambulatory Visit: Payer: Self-pay

## 2019-07-11 NOTE — Lactation Note (Addendum)
This note was copied from a baby's chart. Lactation Consultation Note  Patient Name: Tanya Rodgers Date: 07/11/2019   Spoke to NICU RN Shanda Bumps regarding mom's pumping status and she reported that mom is only doing intermittent pumping; she's not bringing in her breastmilk regularly and that is reflected on flowsheet; baby is being mostly formula fed, he's on Similac 27 calorie formula. Only one bottle of breastmilk found in the NICU refrigerator is about one ounce. NICU will be paging lactation if any questions or concerns arise.    Maternal Data    Feeding Feeding Type: Formula  LATCH Score                   Interventions    Lactation Tools Discussed/Used     Consult Status      Tanya Rodgers 07/11/2019, 7:53 PM

## 2019-07-13 ENCOUNTER — Ambulatory Visit: Payer: Medicaid Other | Admitting: Obstetrics and Gynecology

## 2019-07-17 ENCOUNTER — Ambulatory Visit: Payer: Medicaid Other | Attending: Internal Medicine

## 2019-07-17 DIAGNOSIS — Z20822 Contact with and (suspected) exposure to covid-19: Secondary | ICD-10-CM

## 2019-07-18 LAB — NOVEL CORONAVIRUS, NAA: SARS-CoV-2, NAA: NOT DETECTED

## 2019-07-30 ENCOUNTER — Telehealth: Payer: Self-pay | Admitting: Obstetrics and Gynecology

## 2019-11-13 ENCOUNTER — Encounter (HOSPITAL_COMMUNITY): Payer: Self-pay

## 2019-11-13 ENCOUNTER — Other Ambulatory Visit: Payer: Self-pay

## 2019-11-13 ENCOUNTER — Ambulatory Visit (HOSPITAL_COMMUNITY)
Admission: EM | Admit: 2019-11-13 | Discharge: 2019-11-13 | Disposition: A | Discharge status: Home / Self Care | Payer: Medicaid Other | Attending: Family Medicine | Admitting: Family Medicine

## 2019-11-13 DIAGNOSIS — Z1152 Encounter for screening for COVID-19: Secondary | ICD-10-CM

## 2019-11-13 DIAGNOSIS — U071 COVID-19: Secondary | ICD-10-CM | POA: Insufficient documentation

## 2019-11-13 DIAGNOSIS — Z20822 Contact with and (suspected) exposure to covid-19: Secondary | ICD-10-CM | POA: Diagnosis not present

## 2019-11-13 NOTE — ED Provider Notes (Signed)
[place]    CSN: [id number] Arrival date & time: 11/13/19  1436      History   Chief Complaint Chief Complaint  Patient presents with  . Covid Exposure    HPI Tanya Rodgers is a 24 y.o. female presenting today for Covid testing after exposure.  Patient reports exposure to positive Covid was in the same household.  Currently asymptomatic.  HPI  Past Medical History:  Diagnosis Date  . GBS bacteriuria 02/23/2016   Will need prophylaxis in labor  . Medical history non-contributory   . Supervision of normal first pregnancy, antepartum 02/14/2016    Clinic  [facility] Prenatal Labs Dating  7 week sono Blood type: O/Positive/-- (08/15 1531) O pos Genetic Screen 1 Screen: nl NT   AFP: neg     Antibody:Negative (08/15 1531)neg Anatomic [place]  normal  Rubella: 1.96 (08/15 1531)Immune GTT  Third trimester: nl 2hr RPR: Non Reactive (08/15 1531) NR Flu vaccine  declined HBsAg: Negative (08/15 1531) NEg TDaP vaccine                                           Patient Active Problem List   Diagnosis Date Noted  . Labor and delivery, indication for care 06/07/2019  . Round ligament pain 04/16/2019  . IUGR (intrauterine growth restriction) affecting care of mother 01/28/2019  . Atypical squamous cells cannot exclude high grade squamous intraepithelial lesion on cytologic smear of cervix (ASC-H) 12/24/2018  . Encounter for supervision of normal pregnancy, antepartum 12/10/2018    Past Surgical History:  Procedure Laterality Date  . TONSILLECTOMY      OB History    Gravida  2   Para  2   Term  2   Preterm      AB      Living  2     SAB      TAB      Ectopic      Multiple  0   Live Births  2            Home Medications    Prior to Admission medications   Medication Sig Start Date End Date Taking? Authorizing Provider  acetaminophen (TYLENOL) 325 MG tablet Take 2 tablets (650 mg total) by mouth every 4 (four) hours as needed (for pain scale < 4).  06/09/19   Sparacino, Hailey L, DO  Blood Pressure KIT Monitor blood pressure at home regularly z34.90 standard size cuff 12/10/18   Lajean Manes, CNM  Elastic Bandages & Supports (COMFORT FIT MATERNITY SUPP SM) MISC 1 Units by Does not apply route daily as needed. 04/16/19   Laury Deep, CNM  ferrous sulfate 325 (65 FE) MG tablet Take 1 tablet (325 mg total) by mouth every other day. 06/10/19   Sparacino, Hailey L, DO  ibuprofen (ADVIL) 600 MG tablet Take 1 tablet (600 mg total) by mouth every 6 (six) hours. 06/09/19   Sparacino, Hailey L, DO  Prenatal Vit-Fe Phos-FA-Omega (VITAFOL GUMMIES) 3.33-0.333-34.8 MG CHEW Chew 3 tablets by mouth daily. 12/10/18   Lajean Manes, CNM    Family History History reviewed. No pertinent family history.  Social History Social History   Tobacco Use  . Smoking status: Never Smoker  . Smokeless tobacco: Never Used  Substance Use Topics  . Alcohol use: No  . Drug use: No     Allergies  Kiwi extract, Mushroom extract complex, and Pollen extract   Review of Systems Review of Systems  Constitutional: Negative for activity change, appetite change, chills, fatigue and fever.  HENT: Negative for congestion, ear pain, rhinorrhea, sinus pressure, sore throat and trouble swallowing.   Eyes: Negative for discharge and redness.  Respiratory: Negative for cough, chest tightness and shortness of breath.   Cardiovascular: Negative for chest pain.  Gastrointestinal: Negative for abdominal pain, diarrhea, nausea and vomiting.  Musculoskeletal: Negative for myalgias.  Skin: Negative for rash.  Neurological: Negative for dizziness, light-headedness and headaches.     Physical Exam Triage Vital Signs ED Triage Vitals  Enc Vitals Group     BP 11/13/19 1607 120/61     Pulse Rate 11/13/19 1607 74     Resp 11/13/19 1607 16     Temp 11/13/19 1607 98.6 F (37 C)     Temp Source 11/13/19 1607 Oral     SpO2 11/13/19 1607 99 %     Weight --       Height --      Head Circumference --      Peak Flow --      Pain Score 11/13/19 1612 0     Pain Loc --      Pain Edu? --      Excl. in [place]? --    No data found.  Updated Vital Signs BP 120/61 (BP Location: Left Arm)   Pulse 74   Temp 98.6 F (37 C) (Oral)   Resp 16   LMP  (Within Weeks) Comment: 1 week  SpO2 99%   Visual Acuity Right Eye Distance:   Left Eye Distance:   Bilateral Distance:    Right Eye Near:   Left Eye Near:    Bilateral Near:     Physical Exam Vitals and nursing note reviewed.  Constitutional:      Appearance: She is well-developed.     Comments: No acute distress  HENT:     Head: Normocephalic and atraumatic.     Nose: Nose normal.  Eyes:     Conjunctiva/sclera: Conjunctivae normal.  Cardiovascular:     Rate and Rhythm: Normal rate.  Pulmonary:     Effort: Pulmonary effort is normal. No respiratory distress.  Abdominal:     General: There is no distension.  Musculoskeletal:        General: Normal range of motion.     Cervical back: Neck supple.  Skin:    General: Skin is warm and dry.  Neurological:     Mental Status: She is alert and oriented to person, place, and time.      UC Treatments / Results  Labs (all labs ordered are listed, but only abnormal results are displayed) Labs Reviewed  SARS CORONAVIRUS 2 (TAT 6-24 HRS)    EKG   Radiology No results found.  Procedures Procedures (including critical care time)  Medications Ordered in UC Medications - No data to display  Initial Impression / Assessment and Plan / UC Course  I have reviewed the triage vital signs and the nursing notes.  Pertinent labs & imaging results that were available during my care of the patient were reviewed by me and considered in my medical decision making (see chart for details).     Covid PCR pending, currently asymptomatic.  Recommending retesting if developing any symptoms over the next 2 weeks.  Discussed precautions.Discussed strict  return precautions. Patient verbalized understanding and is agreeable with plan.   Final  Clinical Impressions(s) / UC Diagnoses   Final diagnoses:  Encounter for screening laboratory testing for COVID-19 virus  Exposure to COVID-19 virus     Discharge Instructions     Monitor MyChart for results, follow up for retesting if developing and symptoms in the next 2 weeks    ED Prescriptions    None     PDMP not reviewed this encounter.   Janith Lima, [place] 11/13/19 1638
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
## 2019-11-13 NOTE — Discharge Instructions (Signed)
Monitor MyChart for results, follow up for retesting if developing and symptoms in the next 2 weeks 

## 2019-11-14 LAB — SARS CORONAVIRUS 2 (TAT 6-24 HRS): SARS Coronavirus 2: POSITIVE — AB
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
## 2019-12-18 NOTE — ED Notes (Signed)
ED Patient Education Note     Patient Education Materials Follows:  Musculoskeletal     Rib Contusion    A rib contusion is a deep bruise on your rib area. Contusions are the result of a blunt trauma that causes bleeding and injury to the tissues under the skin. A rib contusion may involve bruising of the ribs and of the skin and muscles in the area. The skin overlying the contusion may turn blue, purple, or yellow. Minor injuries will give you a painless contusion, but more severe contusions may stay painful and swollen for a few weeks.      CAUSES    A contusion is usually caused by a blow, trauma, or direct force to an area of the body. This often occurs while playing contact sports.    SYMPTOMS     Swelling and redness of the injured area.     Discoloration of the injured area.     Tenderness and soreness of the injured area.     Pain with or without movement.    DIAGNOSIS    The diagnosis can be made by taking a medical history and performing a physical exam. An X-ray, CT scan, or MRI may be needed to determine if there were any associated injuries, such as broken bones (fractures) or internal injuries.    TREATMENT    Often, the best treatment for a rib contusion is rest. Icing or applying cold compresses to the injured area may help reduce swelling and inflammation. Deep breathing exercises may be recommended to reduce the risk of partial lung collapse and pneumonia. Over-the-counter or prescription medicines may also be recommended for pain control.    HOME CARE INSTRUCTIONS     Apply ice to the injured area:     ? Put ice in a plastic bag.     ? Place a towel between your skin and the bag.     ? Leave the ice on for 20 minutes, 2?3 times per day.      Take medicines only as directed by your health care provider.      Rest the injured area. Avoid strenuous activity and any activities or movements that cause pain. Be careful during activities and avoid bumping the injured area.     Perform deep-breathing  exercises as directed by your health care provider.      Do not lift anything that is heavier than 5 lb (2.3 kg) until your health care provider approves.      Do not use any tobacco products, including cigarettes, chewing tobacco, or electronic cigarettes. If you need help quitting, ask your health care provider.     SEEK MEDICAL CARE IF:     You have increased bruising or swelling.      You have pain that is not controlled with treatment.      You have a fever.    SEEK IMMEDIATE MEDICAL CARE IF:     You have difficulty breathing or shortness of breath.      You develop a continual cough, or you cough up thick or bloody sputum.      You feel sick to your stomach (nauseous), you throw up (vomit), or you have abdominal pain.     This information is not intended to replace advice given to you by your health care provider. Make sure you discuss any questions you have with your health care provider.    Document Released: 03/13/2001 Document Revised: 07/09/2014 Document Reviewed: 03/30/2014    Elsevier Interactive Patient Education ?2016 Elsevier Inc.
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
## 2020-10-03 IMAGING — US US MFM OB COMP + 14 WK
1 series · 13 of 28 positions shown · non-contrast
Comparison: none

[Series 1: us mfm ob comp + 14 wk · 13 of 61 slices shown]
[im 3/61]
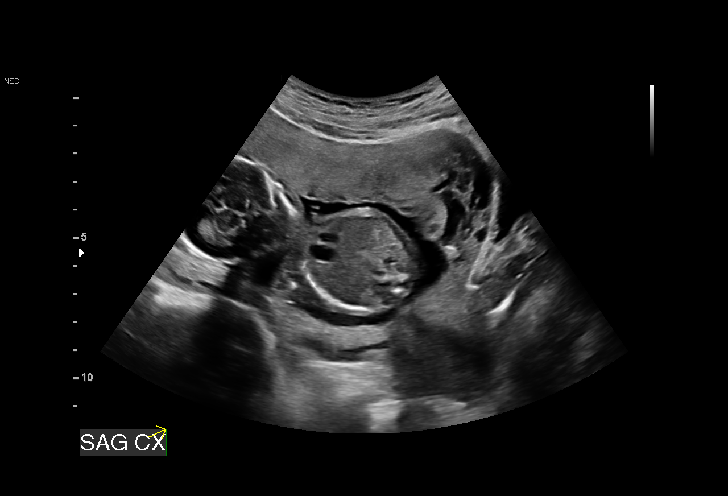
[im 7/61]
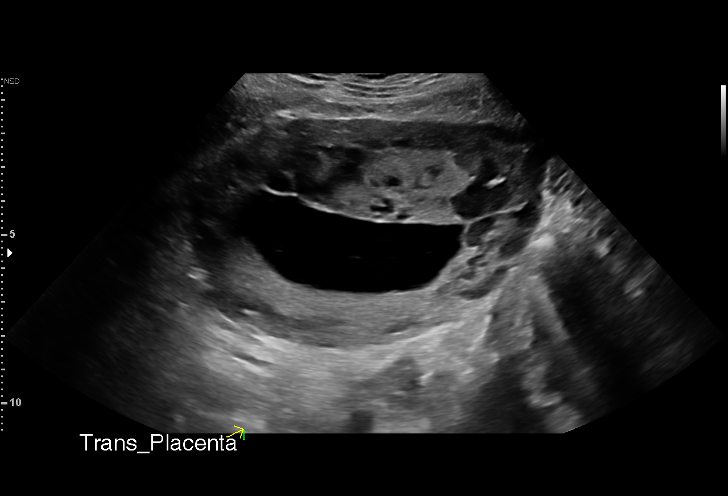
[im 12/61]
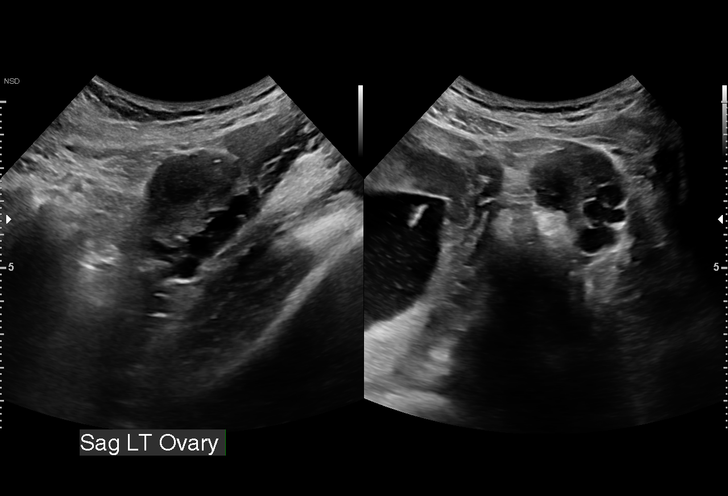
[im 16/61]
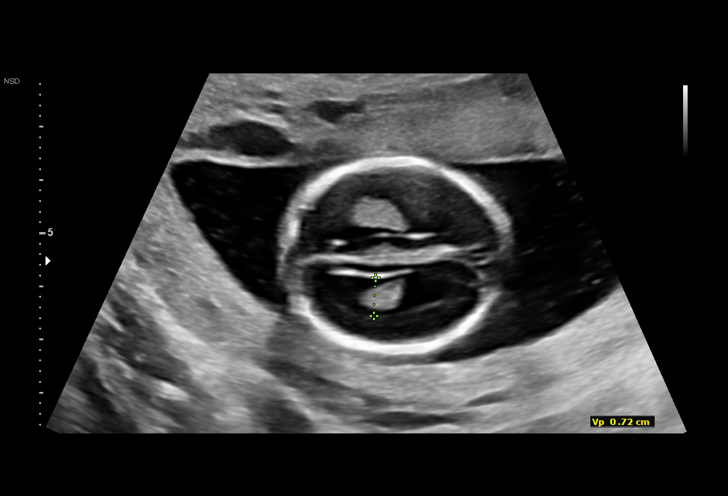
[im 21/61]
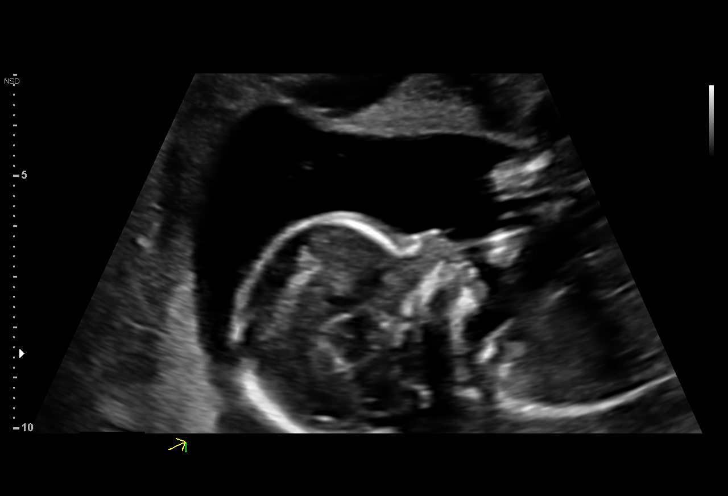
[im 25/61]
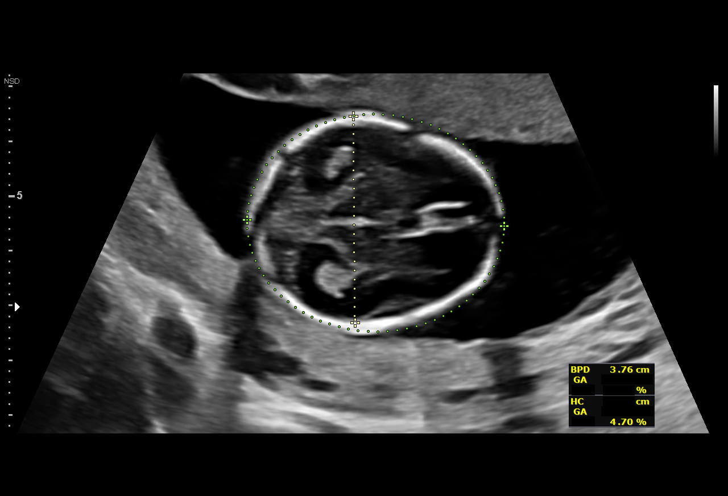
[im 32/61]
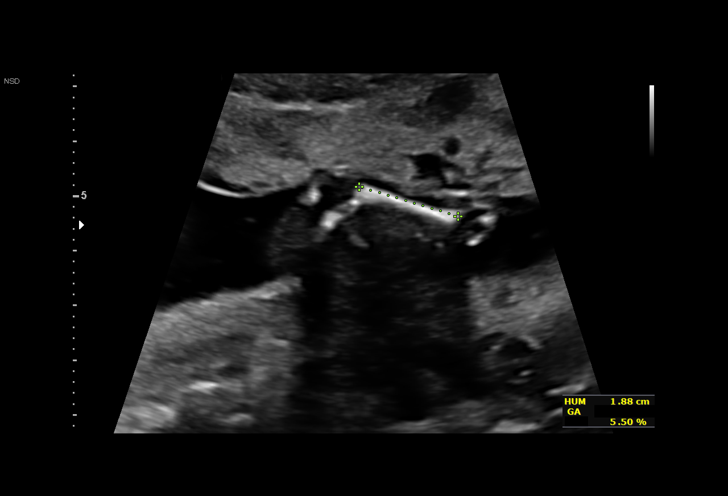
[im 36/61]
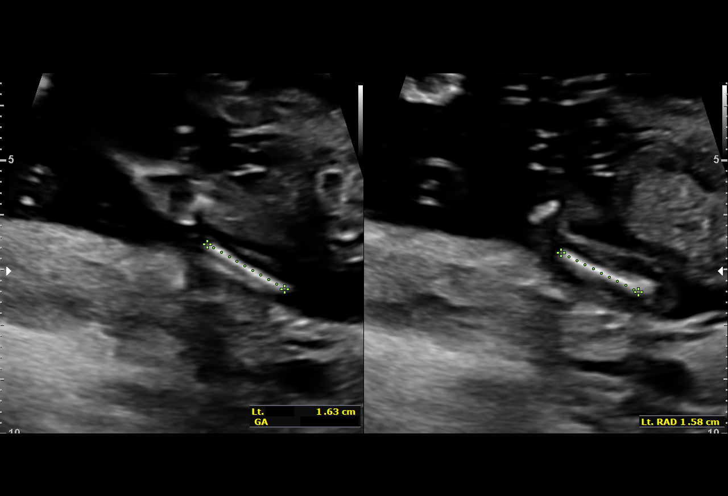
[im 41/61]
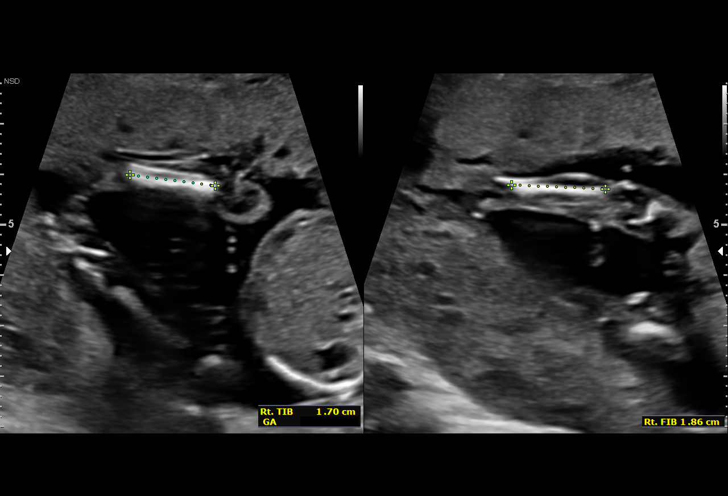
[im 45/61]
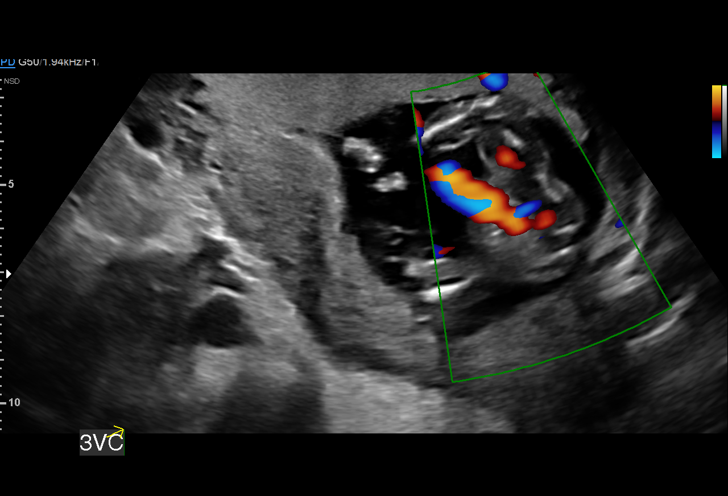
[im 49/61]
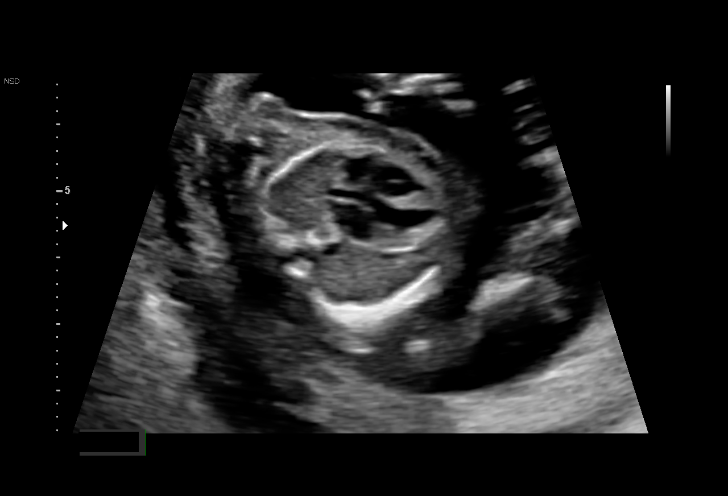
[im 54/61]
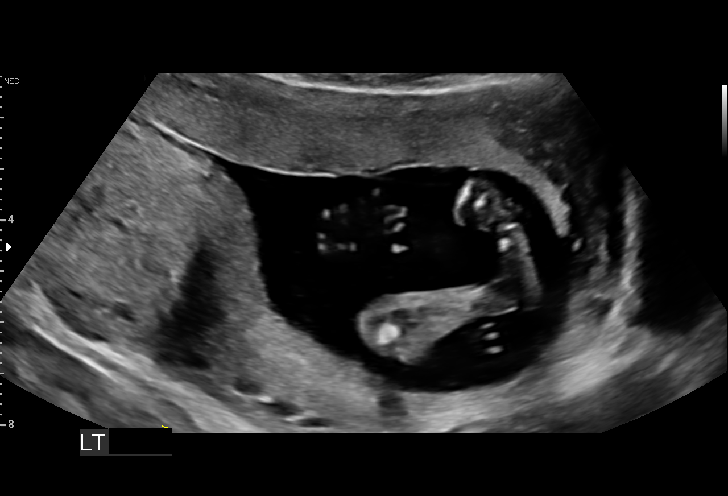
[im 58/61]
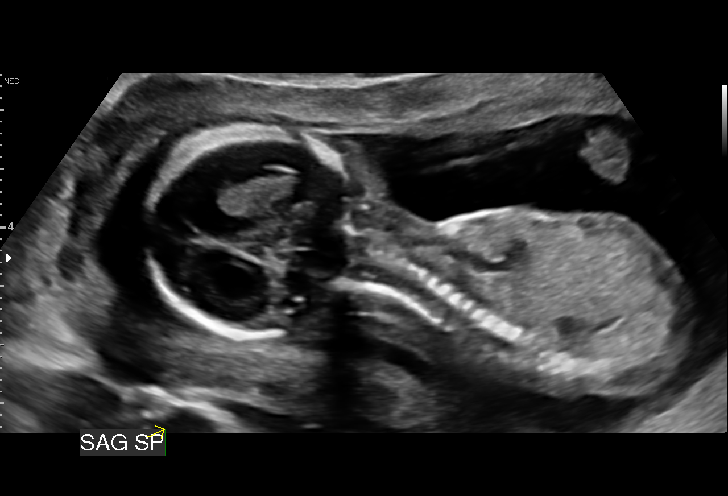

[13 of 28 positions shown; findings below may reference images not displayed]

JOHANNA CNM

  1  US MFM OB COMP + 14 WK               76805.01     WELYTON NAKAHARA
 ----------------------------------------------------------------------

 ----------------------------------------------------------------------
Indications

  Encounter for antenatal screening for
  malformations (LOW risk NIPS, Negative
  Horizon)
  17 weeks gestation of pregnancy
 ----------------------------------------------------------------------
Fetal Evaluation

 Num Of Fetuses:         1
 Fetal Heart Rate(bpm):  137
 Cardiac Activity:       Observed
 Presentation:           Breech
 Placenta:               Anterior
 P. Cord Insertion:      Visualized, central

 Amniotic Fluid
 AFI FV:      Within normal limits

                             Largest Pocket(cm)

Biometry

 BPD:      37.5  mm     G. Age:  17w 3d         51  %    CI:        75.85   %    70 - 86
                                                         FL/HC:      14.4   %    14.6 -
 HC:      136.5  mm     G. Age:  17w 1d         24  %    HC/AC:      1.21        1.07 -
 AC:      112.6  mm     G. Age:  17w 1d         36  %    FL/BPD:     52.5   %
 FL:       19.7  mm     G. Age:  15w 6d          4  %    FL/AC:      17.5   %    20 - 24
 HUM:      18.7  mm     G. Age:  15w 3d        < 5  %
 CER:      17.2  mm     G. Age:  17w 1d         45  %
 NFT:       2.7  mm
 LV:        7.2  mm
 CM:        4.3  mm
  RIGHT
 HUM:      18.5  mm     G. Age:  15w 2d        < 5  %
 FL:       18.4  mm     G. Age:  15w 3d          1  %
 ULN:      18.9  mm     G. Age:  16w 4d         21  %
 TIB:        17  mm     G. Age:  15w 6d         12  %
 RAD:      14.7  mm     G. Age:  14w 6d         18  %
 FIB:      18.6  mm     G. Age:  16w 1d         44  %
  LEFT
 HUM:        20  mm     G. Age:  15w 6d         13  %
 ULN:      16.3  mm     G. Age:  15w 3d        < 5  %
 TIB:      16.9  mm     G. Age:  15w 6d         11  %
 RAD:      15.8  mm     G. Age:  15w 2d         23  %
 FIB:      16.9  mm     G. Age:  15w 4d         37  %

 Est. FW:     161  gm      0 lb 6 oz      7  %
OB History

 Gravidity:    2         Term:   1        Prem:   0        SAB:   0
 TOP:          0       Ectopic:  0        Living: 1
Gestational Age

 LMP:           18w 1d        Date:  09/16/18                 EDD:   06/23/19
 U/S Today:     16w 6d                                        EDD:   07/02/19
 Best:          17w 3d     Det. By:  Early Ultrasound         EDD:   06/28/19
                                     (10/31/18)
Anatomy

 Cranium:               Appears normal         RVOT:                   Not well visualized
 Cavum:                 Appears normal         LVOT:                   Not well visualized
 Ventricles:            Appears normal         Aortic Arch:            Appears normal
 Choroid Plexus:        Appears normal         Ductal Arch:            Not well visualized
 Cerebellum:            Appears normal         Diaphragm:              Appears normal
 Posterior Fossa:       Appears normal         Stomach:                Appears normal, left
                                                                       sided
 Nuchal Fold:           Appears normal         Abdomen:                Appears normal
 Face:                  Appears normal         Abdominal Wall:         Appears nml (cord
                        (orbits and profile)                           insert, abd wall)
 Lips:                  Appears normal         Cord Vessels:           Appears normal (3
                                                                       vessel cord)
 Palate:                Not well visualized    Kidneys:                Appear normal
 Thoracic:              Appears normal         Bladder:                Not well visualized
 Heart:                 Appears normal         Spine:                  Limited views
                        (4CH, axis, and                                appear normal
                        situs)

 Other:  Fetus appears to be a male. Heels and 5th digit visualized. Open
         hands visualized. Nasal bone visualized.
Cervix Uterus Adnexa

 Cervix
 Length:           3.25  cm.
 Normal appearance by transabdominal scan.

 Left Ovary
 Within normal limits.
 Right Ovary
 Not visualized.

 Adnexa
 No abnormality visualized.
Impression

 IUGR EFW 7th% with normal AC
 Low risk NIPS
 Horizon negative
 I discussed with Ms. Abel Josue Av finding of IUGR. We
 discussed that the growth is primarily driven by shortened
 long bones, most significantly, the femur. We discussed that
 the differential diagnosis includes constiutional, skeletal
 dysplasia spectrum in particular achodroplasia and
 rhizomelia and aneuploidy. Ms. Asmael notes that she is 5'1'
 and the FOB is not much taller than her. She also notes that
 her family is short with some members 4' + inches. At this
 time she declines additional testing. The bones are not
 bowed and are appropriately ossified, the chest is not
 narrowed, the hands appear normal and there is no evidence
 of frontal bossing.
Recommendations

 Follow up growth in 4 weeks.

## 2020-12-26 IMAGING — US US MFM UA CORD DOPPLER
1 series · 9 of 9 positions shown · non-contrast
Comparison: none

[Series 1: us mfm ua cord doppler · 9 of 9 slices shown]
[im 1/9]
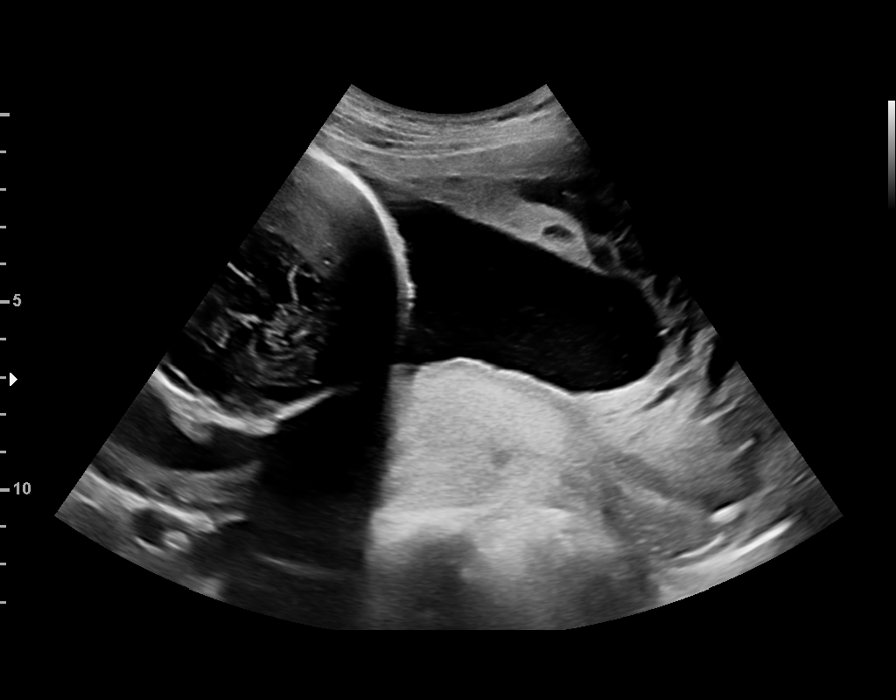
[im 2/9]
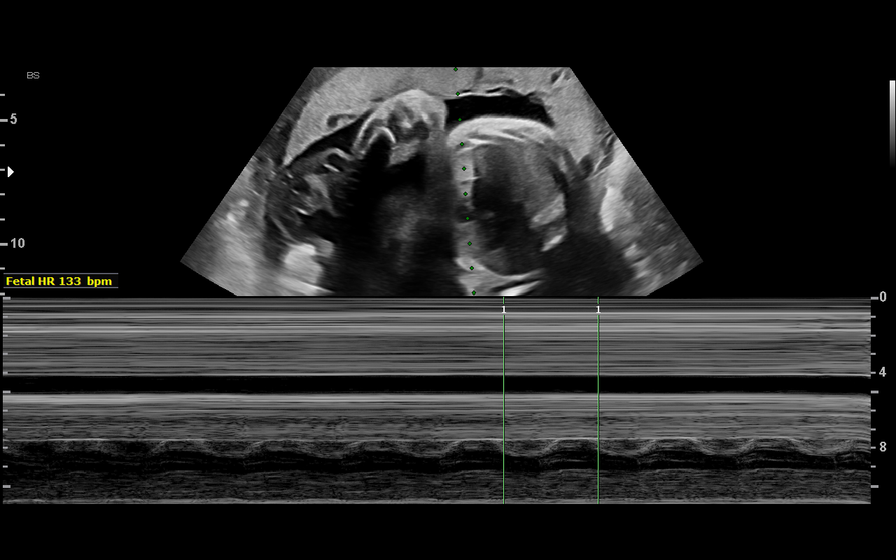
[im 3/9]
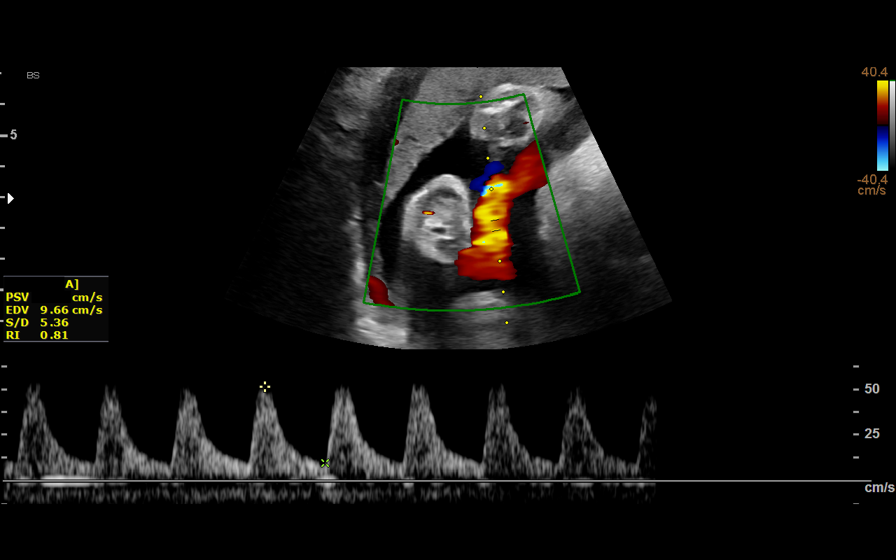
[im 4/9]
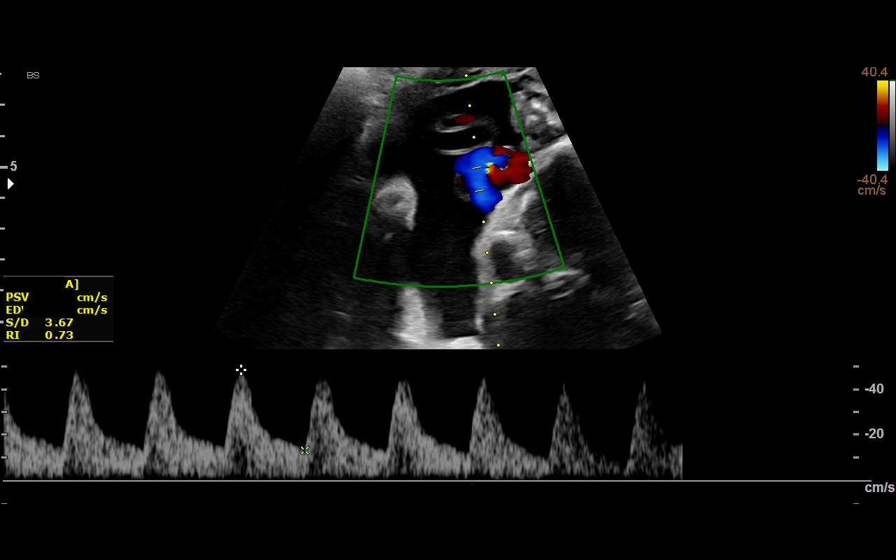
[im 5/9]
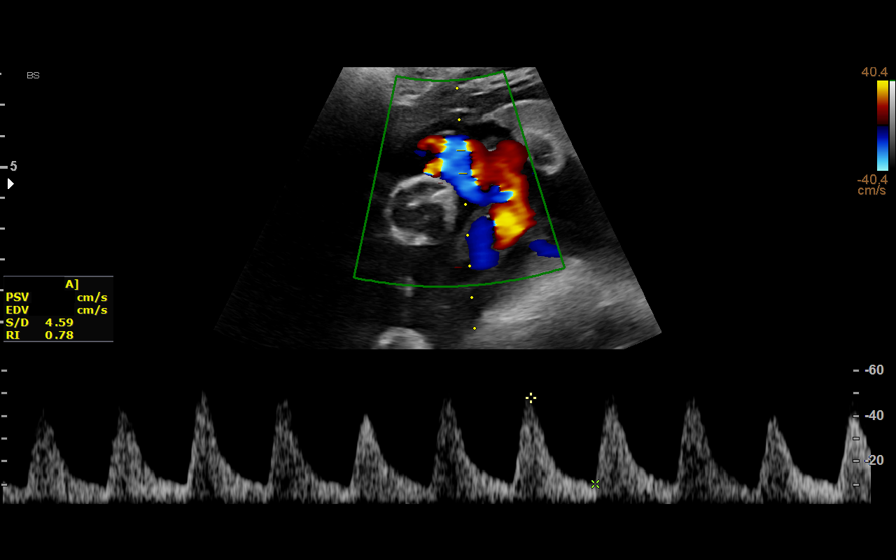
[im 6/9]
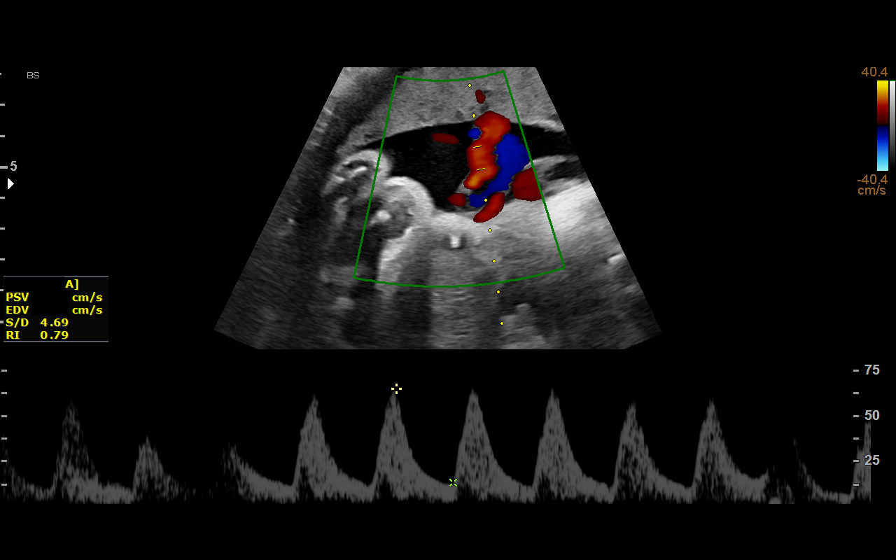
[im 7/9]
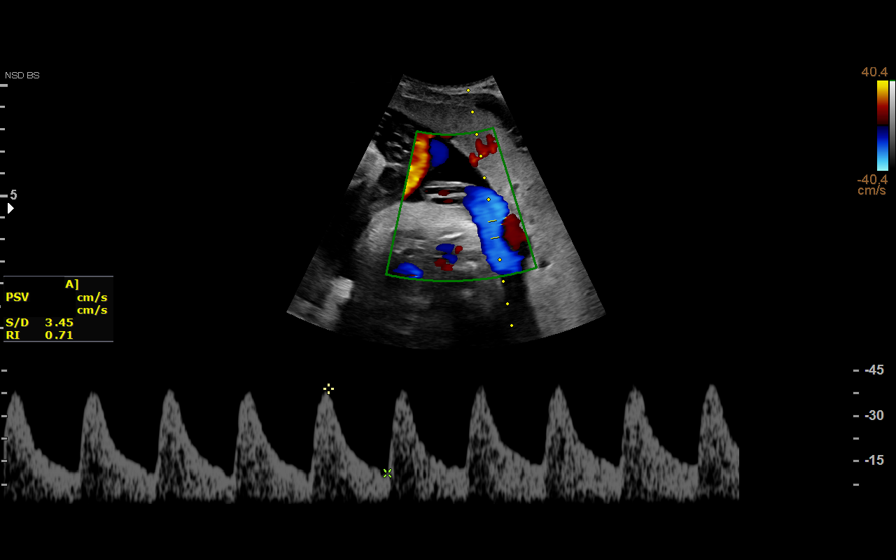
[im 8/9]
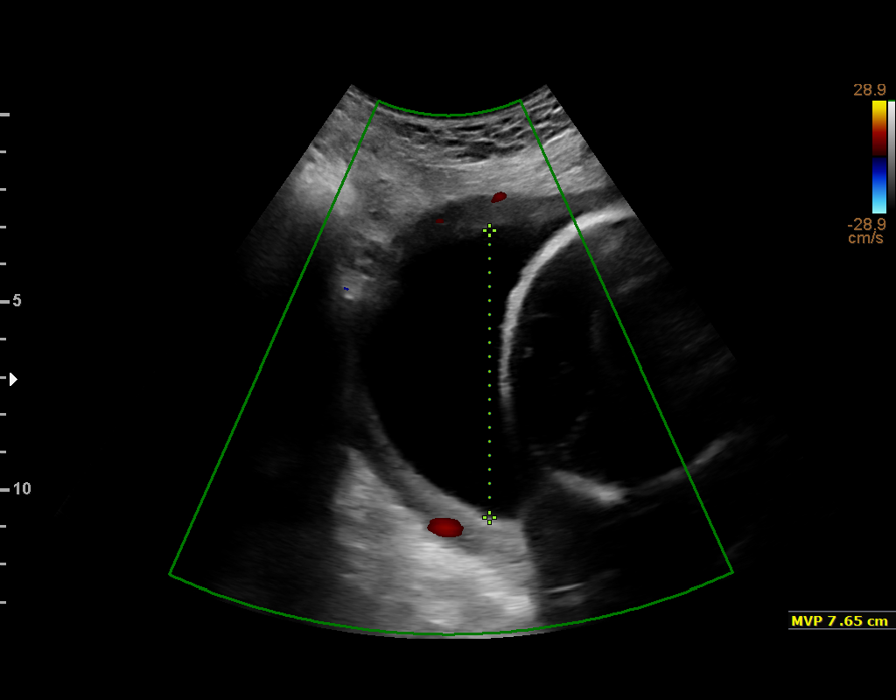
[im 9/9]
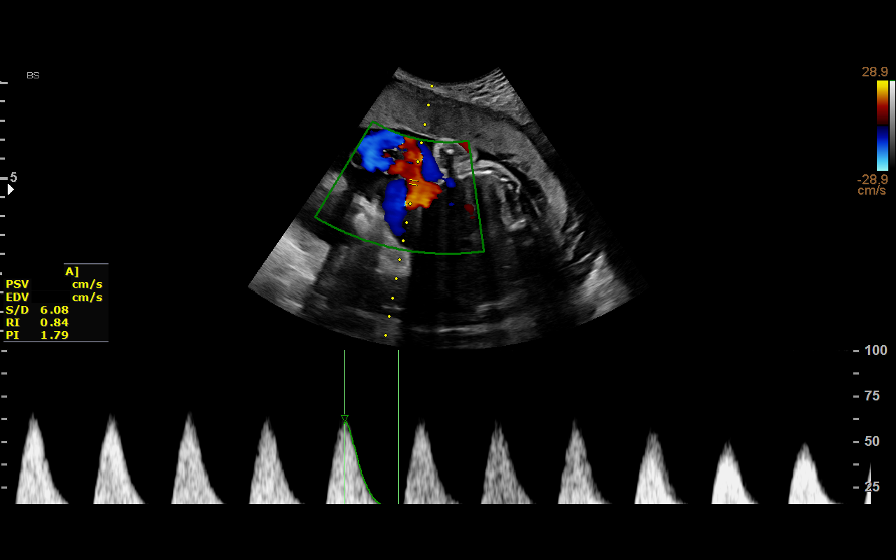

[9 of 9 positions shown; findings below may reference images not displayed]

----------------------------------------------------------------------

 ----------------------------------------------------------------------
Indications

  Maternal care for known or suspected poor
  fetal growth, third trimester, not applicable or
  unspecified IUGR
  29 weeks gestation of pregnancy
  Fetal abnormality - other known or
  suspected (shortened long bones)
 ----------------------------------------------------------------------
Fetal Evaluation

 Num Of Fetuses:         1
 Fetal Heart Rate(bpm):  133
 Cardiac Activity:       Observed
 Presentation:           Cephalic

 Amniotic Fluid
 AFI FV:      Within normal limits

                             Largest Pocket(cm)

OB History

 Gravidity:    2         Term:   1        Prem:   0        SAB:   0
 TOP:          0       Ectopic:  0        Living: 1
Gestational Age

 LMP:           30w 1d        Date:  09/16/18                 EDD:   06/23/19
 Best:          29w 3d     Det. By:  Early Ultrasound         EDD:   06/28/19
                                     (10/31/18)
Doppler - Fetal Vessels

 Umbilical Artery
  S/D     %tile                                            ADFV    RDFV
 4.27       96                                                No      No

Impression

 Severe fetal growth restriction.  On ultrasound performed last
 week the estimated fetal weight was at the 1st percentile.
 Obstetric history significant for a term vaginal delivery of a
 female infant weighing 7 pounds at birth.  Her pregnancy was
 uncomplicated.

 On today's ultrasound, amniotic fluid is normal and good fetal
 activity seen.  Umbilical artery Doppler showed increased S/D
 ratio.  NST is reactive (10 x 10 bpm).

 I counseled the patient (her mother was present FaceTime on
 phone) on the possible causes of fetal growth restriction
 including placental insufficiency (most common), fetal
 chromosomal anomalies and rarely infections.  I discussed
 the option of amniocentesis that can be performed to
 determine the fetal karyotype and some genetic conditions.  I
 discussed frequency of fetal monitoring and timing of delivery
 that will be based on ultrasound findings.

 Patient opted not to have amniocentesis. She understands
 the limitations of antenatal testing in predicting fetal
 compromise. She reports good fetal movements.
Recommendations

 -UA Doppler and NST next week.
 -Fetal growth in 2 weeks.
 -BPP from 32 weeks' gestation.
                 Tiger, Reese

## 2021-02-05 IMAGING — US US MFM FETAL BPP W/O NON-STRESS
1 series · 13 of 28 positions shown · non-contrast
Comparison: none

[Series 1: us mfm fetal bpp w/o non-stress · 39 acquisitions, 13 frames shown]
[im 2/39]
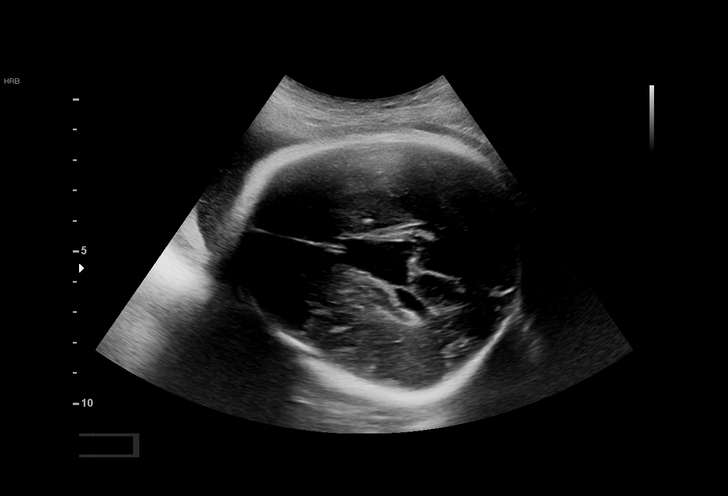
[im 5/39]
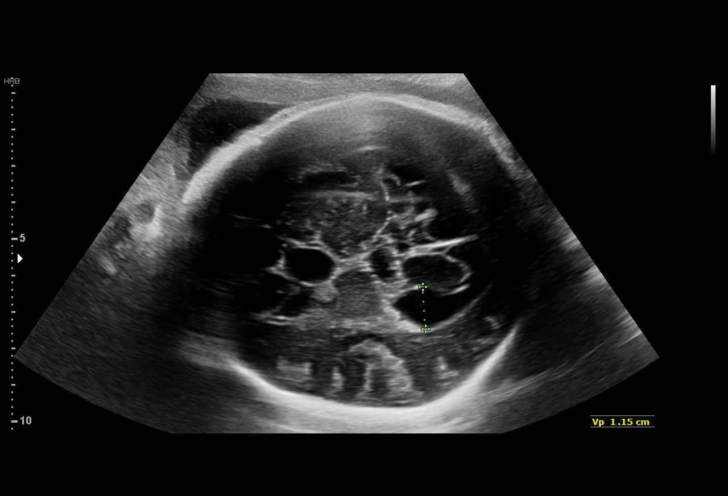
[im 8/39]
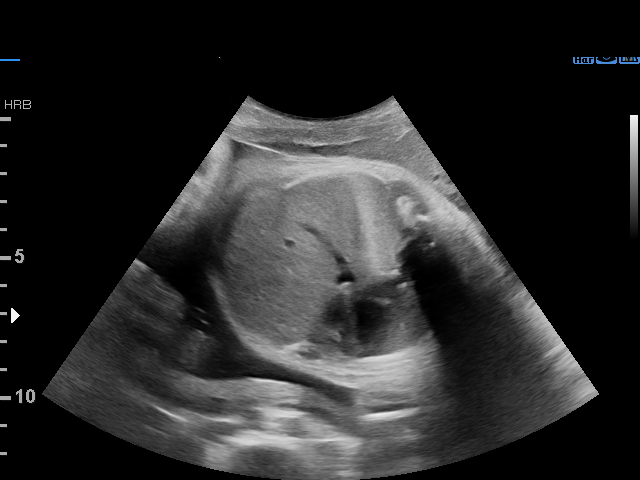
[im 10/39]
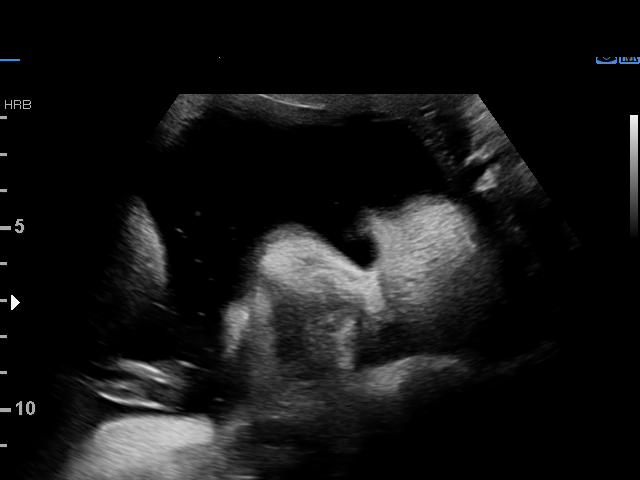
[im 13/39]
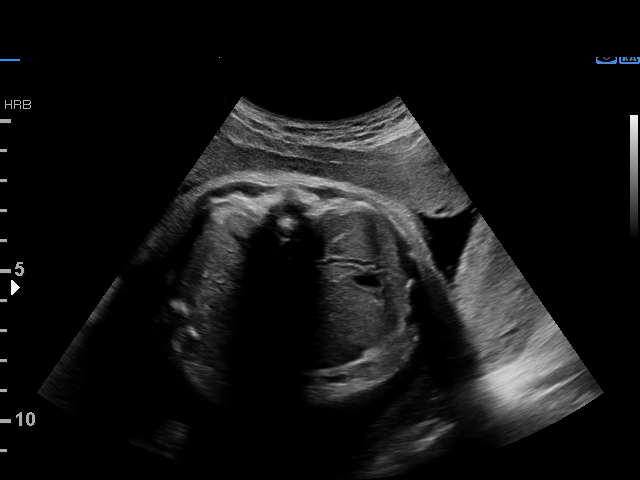
[im 16/39]
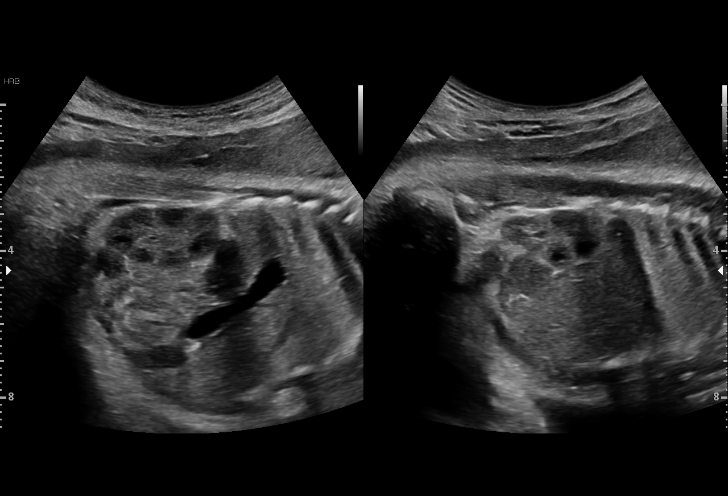
[im 20/39]
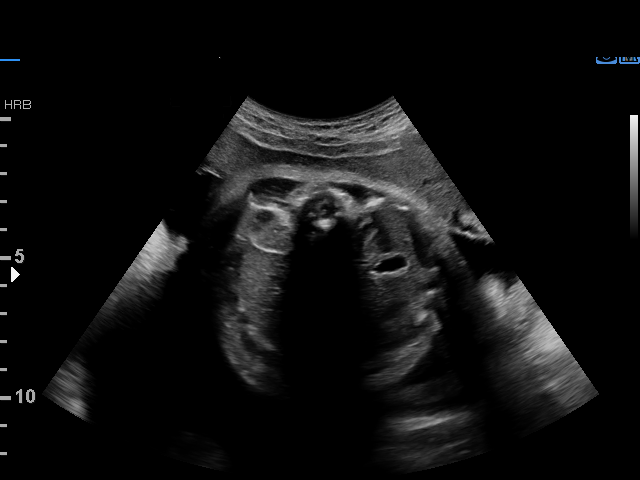
[im 23/39]
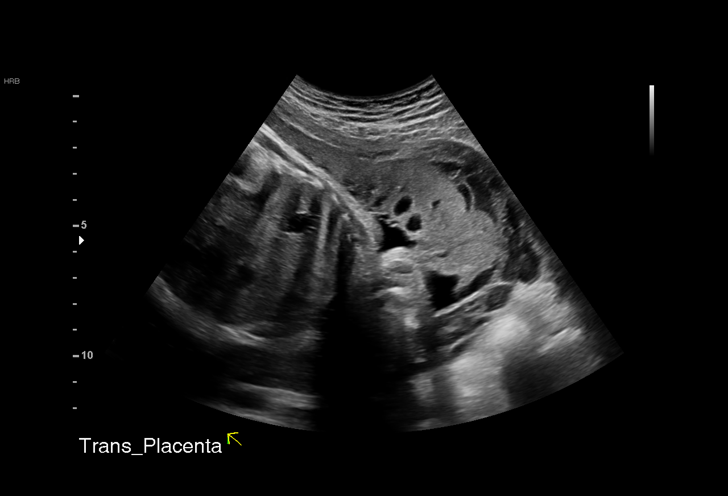
[im 26/39]
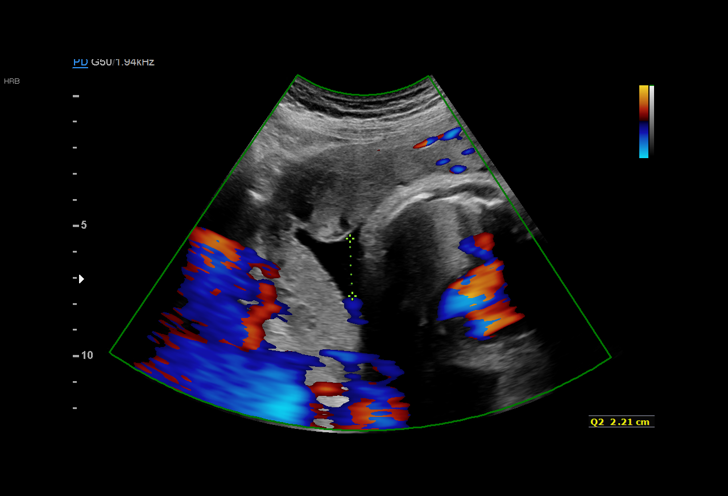
[im 29/39]
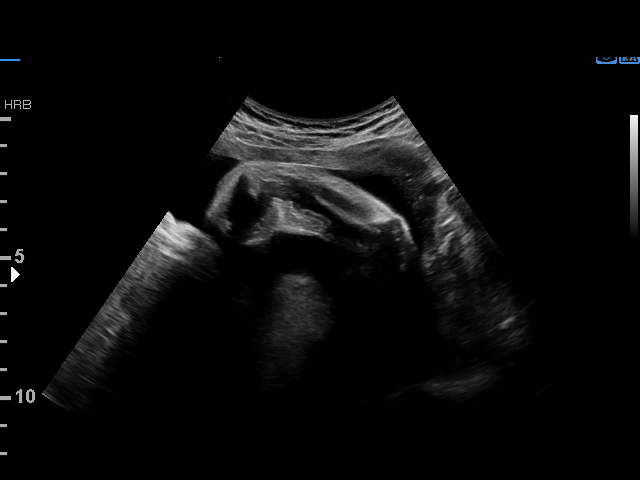
[im 31/39]
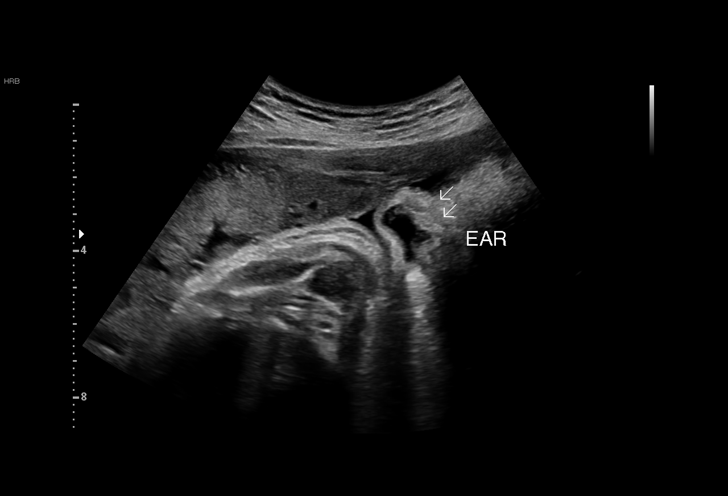
[im 34/39]
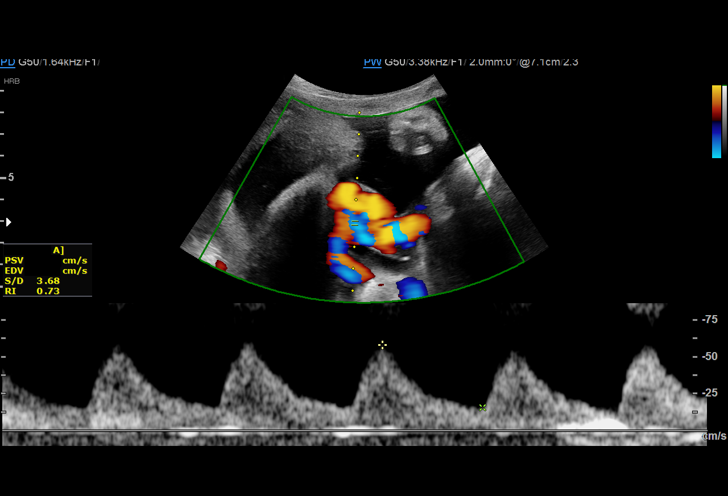
[im 37/39]
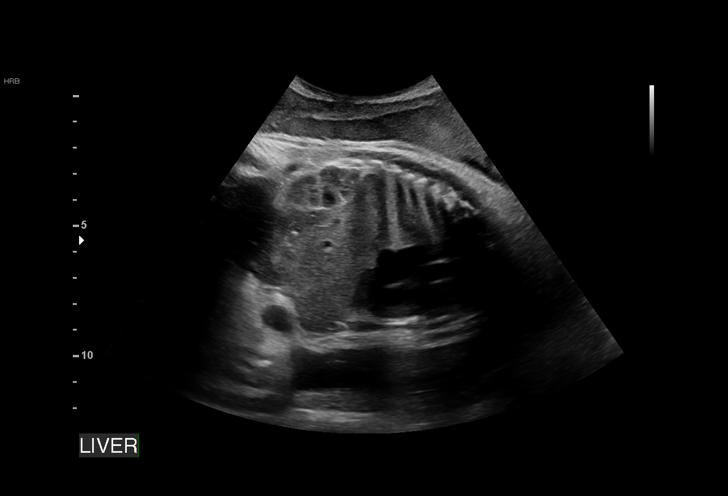

[13 of 28 positions shown; findings below may reference images not displayed]

STRESS                                            ERWIN
  2  US MFM UA CORD DOPPLER               76820.02     SANDRINE SASA SANDRA
                                                       ERWIN
 ----------------------------------------------------------------------

 ----------------------------------------------------------------------
Indications

  Encounter for other antenatal screening
  follow-up
  Maternal care for known or suspected poor
  fetal growth, third trimester, not applicable or
  unspecified IUGR
  Fetal abnormality - other known or
  suspected (shortened long bones)
  35 weeks gestation of pregnancy
 ----------------------------------------------------------------------
Vital Signs

 BMI:
Fetal Evaluation

 Num Of Fetuses:         1
 Fetal Heart Rate(bpm):  144
 Cardiac Activity:       Observed
 Presentation:           Cephalic
 Placenta:               Anterior
 P. Cord Insertion:      Previously Visualized

 Amniotic Fluid
 AFI FV:      Within normal limits

 AFI Sum(cm)     %Tile       Largest Pocket(cm)
 10.14           22
               RLQ(cm)       LUQ(cm)        LLQ(cm)

Biometry

 LV:       11.5  mm
OB History

 Gravidity:    2         Term:   1        Prem:   0        SAB:   0
 TOP:          0       Ectopic:  0        Living: 1
Gestational Age

 LMP:           36w 0d        Date:  09/16/18                 EDD:   06/23/19
 Best:          35w 2d     Det. By:  Early Ultrasound         EDD:   06/28/19
                                     (10/31/18)
Anatomy

 Cranium:               Appears normal         LVOT:                   Previously seen
 Cavum:                 Appears normal         Aortic Arch:            Previously seen
 Ventricles:            Ventriculomegaly,      Ductal Arch:            Previously seen
                        11.5 mm
 Choroid Plexus:        Previously seen        Diaphragm:              Appears normal
 Cerebellum:            Previously seen        Stomach:                Appears normal, left
                                                                       sided
 Posterior Fossa:       Previously seen        Abdomen:                Appears normal
 Nuchal Fold:           Not applicable (>20    Abdominal Wall:         Previously seen
                        wks GA)
 Face:                  Orbits and profile     Cord Vessels:           Previously seen
                        previously seen
 Lips:                  Previously seen        Kidneys:                Appear normal
 Palate:                Previously seen        Bladder:                Appears normal
 Thoracic:              Appears normal         Spine:                  Previously seen
 Heart:                 Previously seen        Upper Extremities:      Previously seen
 RVOT:                  Previously seen        Lower Extremities:      Previously seen
Doppler - Fetal Vessels

 Umbilical Artery
  S/D     %tile
 3.81       97

Cervix Uterus Adnexa

 Cervix
 Not visualized (advanced GA >02wks)
Impression

 Severe fetal growth restriction.  Patient return for antenatal
 testing.  On ultrasound performed last week, intracranial
 abnormality was suspected.
 On cell free fetal DNA screening the risk for fetal aneuploidies
 were not increased.
 Her blood pressures have been normal at prenatal visits.

 On today's ultrasound, amniotic fluid is normal and good fetal
 activity seen.  Antenatal testing is reassuring.  BPP [DATE].
 Umbilical artery Doppler showed normal forward diastolic flow.
 Unilateral ventriculomegaly, measuring 1.2 cm, is seen.  The
 near transducer ventricle could not be assessed.  Third
 ventricle also appears enlarged.  No evidence of
 periventricular calcifications.  Posterior fossa appears normal.
 Abdomen and liver look normal without any evidence of
 calcification.

 I counseled the patient on the finding of ventriculomegaly that
 it can be associated with chromosomal malformations,
 genetic syndromes, infection and normal fetus.  In most
 cases, isolated borderline ventriculomegaly is not associated
 with adverse neurodevelopmental outcomes.  Only postnatal
 evaluation by ultrasound or MRI will confirm abnormalities if
 present.
 I did not recommend amniocentesis now because of her
 decision to deliver in 2 weeks (fetal growth restriction).
 I have recommended that she be screened for CMV and
 toxoplasmosis.
Recommendations

 -Blood was drawn today for CMV and toxo IgG and IgM.
 -BPP and UA Doppler next week.
 -Recommend delivery at 37 weeks gestation.
 -Patient has an appointment with her obstetrician next week.

                 Meade, Keyur

## 2021-02-16 ENCOUNTER — Encounter (HOSPITAL_COMMUNITY): Payer: Self-pay | Admitting: Emergency Medicine

## 2021-02-16 ENCOUNTER — Emergency Department (HOSPITAL_COMMUNITY): Payer: Medicaid Other

## 2021-02-16 ENCOUNTER — Other Ambulatory Visit: Payer: Self-pay

## 2021-02-16 ENCOUNTER — Emergency Department (HOSPITAL_COMMUNITY)
Admission: EM | Admit: 2021-02-16 | Discharge: 2021-02-16 | Disposition: A | Discharge status: Home / Self Care | Payer: Medicaid Other | Attending: Emergency Medicine | Admitting: Emergency Medicine

## 2021-02-16 DIAGNOSIS — S60221A Contusion of right hand, initial encounter: Secondary | ICD-10-CM | POA: Diagnosis not present

## 2021-02-16 DIAGNOSIS — S6991XA Unspecified injury of right wrist, hand and finger(s), initial encounter: Secondary | ICD-10-CM | POA: Diagnosis present

## 2021-02-16 DIAGNOSIS — W208XXA Other cause of strike by thrown, projected or falling object, initial encounter: Secondary | ICD-10-CM | POA: Diagnosis not present

## 2021-02-16 NOTE — ED Provider Notes (Signed)
[facility] EMERGENC[facility] DEPARTMENT Provider Note   CSN: [id number] Arrival date & time: 02/16/21  0041     History Chief Complaint  Patient presents with   Hand Injury    Tanya Rodgers is a 25 y.o. female who presents with one day of hand pain and difficulty with movement after dropping a heavy box of glass on it last night. The patient reports she was initially able to move the hand without difficulty but that the pain was worse today and she felt some numbness / tingling.    Hand Injury     Past Medical History:  Diagnosis Date   GBS bacteriuria 02/23/2016   Will need prophylaxis in labor   Medical history non-contributory    Supervision of normal first pregnancy, antepartum 02/14/2016    Clinic  [facility] Prenatal Labs Dating  7 week sono Blood type: O/Positive/-- (08/15 1531) O pos Genetic Screen 1 Screen: nl NT   AFP: neg     Antibody:Negative (08/15 1531)neg Anatomic [place]  normal  Rubella: 1.96 (08/15 1531)Immune GTT  Third trimester: nl 2hr RPR: Non Reactive (08/15 1531) NR Flu vaccine  declined HBsAg: Negative (08/15 1531) NEg TDaP vaccine                                           Patient Active Problem List   Diagnosis Date Noted   Labor and delivery, indication for care 06/07/2019   Round ligament pain 04/16/2019   IUGR (intrauterine growth restriction) affecting care of mother 01/28/2019   Atypical squamous cells cannot exclude high grade squamous intraepithelial lesion on cytologic smear of cervix (ASC-H) 12/24/2018   Encounter for supervision of normal pregnancy, antepartum 12/10/2018    Past Surgical History:  Procedure Laterality Date   TONSILLECTOMY       OB History     Gravida  2   Para  2   Term  2   Preterm      AB      Living  2      SAB      IAB      Ectopic      Multiple  0   Live Births  2           No family history on file.  Social History   Tobacco Use   Smoking status: Never   Smokeless tobacco:  Never  Vaping Use   Vaping Use: Never used  Substance Use Topics   Alcohol use: No   Drug use: No    Home Medications Prior to Admission medications   Medication Sig Start Date End Date Taking? Authorizing Provider  acetaminophen (TYLENOL) 325 MG tablet Take 2 tablets (650 mg total) by mouth every 4 (four) hours as needed (for pain scale < 4). 06/09/19   Sparacino, Hailey L, DO  Blood Pressure KIT Monitor blood pressure at home regularly z34.90 standard size cuff 12/10/18   Lajean Manes, CNM  Elastic Bandages & Supports (COMFORT FIT MATERNITY SUPP SM) MISC 1 Units by Does not apply route daily as needed. 04/16/19   Laury Deep, CNM  ferrous sulfate 325 (65 FE) MG tablet Take 1 tablet (325 mg total) by mouth every other day. 06/10/19   Sparacino, Hailey L, DO  ibuprofen (ADVIL) 600 MG tablet Take 1 tablet (600 mg total) by mouth every 6 (six) hours. 06/09/19  Sparacino, Hailey L, DO  Prenatal Vit-Fe Phos-FA-Omega (VITAFOL GUMMIES) 3.33-0.333-34.8 MG CHEW Chew 3 tablets by mouth daily. 12/10/18   Lajean Manes, CNM    Allergies    Kiwi extract, Mushroom extract complex, and Pollen extract  Review of Systems   Review of Systems  Musculoskeletal:  Negative for joint swelling.  Skin:  Negative for color change, pallor and wound.  All other systems reviewed and are negative.  Physical Exam Updated Vital Signs BP 118/79   Pulse 92   Temp 98.6 F (37 C)   Resp 17   SpO2 100%   Physical Exam Constitutional:      Appearance: Normal appearance.  HENT:     Head: Normocephalic and atraumatic.     Nose: No congestion or rhinorrhea.  Eyes:     Extraocular Movements: Extraocular movements intact.     Pupils: Pupils are equal, round, and reactive to light.  Cardiovascular:     Rate and Rhythm: Normal rate and regular rhythm.     Comments: Radial and ulnar pulses intact in BIL UE Pulmonary:     Effort: Pulmonary effort is normal.     Breath sounds: Normal breath sounds.   Musculoskeletal:     Comments: Some reduced strength on wrist extension, flexion, flexion and extension of digits secondary to pain. Able to close fist, able to move each digit passively and actively. No snuff box tenderness.  Skin:    Comments: Normal temperature, without excessive swelling, redness, or pallor.  Neurological:     Mental Status: She is alert and oriented to person, place, and time.     Comments: Intact sensation in all five digits, dorsum of hand, and palm    ED Results / Procedures / Treatments   Labs (all labs ordered are listed, but only abnormal results are displayed) Labs Reviewed - No data to display  EKG None  Radiology DG Wrist Complete Right  Result Date: 02/16/2021 CLINICAL DATA:  Crush injury to the hand and wrist yesterday with persistent pain, initial encounter EXAM: RIGHT WRIST - COMPLETE 3+ VIEW COMPARISON:  None. 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 No acute fracture or dislocation is noted. No soft tissue abnormality is seen. IMPRESSION: Congenital fusion as described.  No acute abnormality is noted. Electronically Signed   By: Inez Catalina M.D.   On: 02/16/2021 01:47   DG Hand Complete Right  Result Date: 02/16/2021 CLINICAL DATA:  Recent crush injury yesterday with persistent pain, initial encounter EXAM: RIGHT HAND - COMPLETE 3+ VIEW COMPARISON:  None. FINDINGS: Fusion of the lunate and triquetrum is noted similar to that seen on recent wrist x-ray. No acute fracture or dislocation is noted. No soft tissue abnormality is noted. IMPRESSION: No acute abnormality noted. Electronically Signed   By: Inez Catalina M.D.   On: 02/16/2021 01:48    Procedures Procedures   Medications Ordered in ED Medications - No data to display  ED Course  I have reviewed the triage vital signs and the nursing notes.  Pertinent labs & imaging results that were available during my care of the patient were reviewed  by me and considered in my medical decision making (see chart for details).    MDM Rules/Calculators/A&P                         Xray without acute fracture. No evidence of snuffbox tenderness, do not favor scaphoid fracture. Grossly neurovascularly  intact. Patient able to move the hand without difficulty, and with some pain after being told Xray is normal. Favor simple contusion. Recommend anti inflammatories and rest. Return precautions discussed, patient to follow up with orthopedist if pain does not improve in coming days.   Final Clinical Impression(s) / ED Diagnoses Final diagnoses:  Contusion of right hand, initial encounter    Rx / DC Orders ED Discharge Orders     None        Dorien Chihuahua 02/16/21 0224    Ezequiel Essex, MD 02/16/21 [phone]

## 2021-02-16 NOTE — ED Provider Notes (Signed)
Emergency Medicine Provider Triage Evaluation Note  Tanya Rodgers , a 25 y.o. female  was evaluated in triage.  Pt complains of right hand and wrist pain after dropping a heavy box of glass on it last night. Patient reports she was not initially in a lot of pain but today it hurts too much to move and there is some numbness and tingling. Patient has not taken anything for the pain.  Review of Systems  Positive: Hand pain, numbness Negative: Color change  Physical Exam  BP 127/87 (BP Location: Right Arm)   Pulse (!) 106   Resp 20   SpO2 95%  Gen:   Awake, no distress   Resp:  Normal effort  MSK:   Decreased strength and ROM of right hand and wrist due to pain Other:  Intact radial and ulnar pulses, intact sensation in hand and all 5 digits  Medical Decision Making  Medically screening exam initiated at 12:51 AM.  Appropriate orders placed.  Tanya Rodgers was informed that the remainder of the evaluation will be completed by another provider, this initial triage assessment does not replace that evaluation, and the importance of remaining in the ED until their evaluation is complete.  Hand injury   [place] 02/16/21 0053    Glynn Octave, MD 02/16/21 [phone]

## 2021-02-16 NOTE — ED Notes (Signed)
All care rendered by provider. 

## 2021-02-16 NOTE — ED Triage Notes (Signed)
Pt dropped a box on her right hand.  Very painful to touch, some swelling.

## 2021-02-16 NOTE — Discharge Instructions (Addendum)
As we discussed, there is no evidence of a break on your xrays. I would take an anti inflammatory such as ibuprofen for the pain and swelling. If it is not improving in the next few days, I would follow up with an orthopedist.

## 2021-05-01 ENCOUNTER — Emergency Department (HOSPITAL_COMMUNITY)
Admission: EM | Admit: 2021-05-01 | Discharge: 2021-05-01 | Disposition: A | Discharge status: Home / Self Care | Payer: Medicaid Other | Attending: Emergency Medicine | Admitting: Emergency Medicine

## 2021-05-01 ENCOUNTER — Other Ambulatory Visit: Payer: Self-pay

## 2021-05-01 ENCOUNTER — Encounter (HOSPITAL_COMMUNITY): Payer: Self-pay | Admitting: Emergency Medicine

## 2021-05-01 DIAGNOSIS — Y93G1 Activity, food preparation and clean up: Secondary | ICD-10-CM | POA: Diagnosis not present

## 2021-05-01 DIAGNOSIS — Z23 Encounter for immunization: Secondary | ICD-10-CM | POA: Insufficient documentation

## 2021-05-01 DIAGNOSIS — S61411A Laceration without foreign body of right hand, initial encounter: Secondary | ICD-10-CM | POA: Insufficient documentation

## 2021-05-01 DIAGNOSIS — W260XXA Contact with knife, initial encounter: Secondary | ICD-10-CM | POA: Insufficient documentation

## 2021-05-01 MED ORDER — LIDOCAINE-EPINEPHRINE (PF) 2 %-1:200000 IJ SOLN
20.0000 mL | Freq: Once | INTRAMUSCULAR | Status: AC
  Administered 2021-05-01: 20 mL
  Filled 2021-05-01: qty 20

## 2021-05-01 MED ORDER — TETANUS-DIPHTH-ACELL PERTUSSIS 5-2.5-18.5 LF-MCG/0.5 IM SUSY
0.5000 mL | PREFILLED_SYRINGE | Freq: Once | INTRAMUSCULAR | Status: AC
  Administered 2021-05-01: 0.5 mL via INTRAMUSCULAR
  Filled 2021-05-01: qty 0.5

## 2021-05-01 MED ORDER — LIDOCAINE-EPINEPHRINE-TETRACAINE (LET) TOPICAL GEL
3.0000 mL | Freq: Once | TOPICAL | Status: AC
  Administered 2021-05-01: 3 mL via TOPICAL
  Filled 2021-05-01: qty 3

## 2021-05-01 NOTE — ED Triage Notes (Signed)
Patient accidentally cut her right medial palm while washing knife last night , presenst with approx. 1" skin laceration at palm .

## 2021-05-01 NOTE — ED Provider Notes (Signed)
[facility] EMERGENCY DEPARTMENT Provider Note   CSN: [id number] Arrival date & time: 05/01/21  0234     History Chief Complaint  Patient presents with   Hand Laceration     Tanya Rodgers is a 25 y.o. female presents to the emergency department complaining of 2 lacerations to the medial portion of the right palm onset just prior to arrival while washing dishes.  Patient reports the knife she was washing slipped and cut her hand.  Unknown last tetanus shot.  No treatments prior to arrival.  No aggravating or alleviating factors.  The history is provided by the patient and medical records. No language interpreter was used.      Past Medical History:  Diagnosis Date   GBS bacteriuria 02/23/2016   Will need prophylaxis in labor   Medical history non-contributory    Supervision of normal first pregnancy, antepartum 02/14/2016    Clinic  [facility] Prenatal Labs Dating  7 week sono Blood type: O/Positive/-- (08/15 1531) O pos Genetic Screen 1 Screen: nl NT   AFP: neg     Antibody:Negative (08/15 1531)neg Anatomic [place]  normal  Rubella: 1.96 (08/15 1531)Immune GTT  Third trimester: nl 2hr RPR: Non Reactive (08/15 1531) NR Flu vaccine  declined HBsAg: Negative (08/15 1531) NEg TDaP vaccine                                           Patient Active Problem List   Diagnosis Date Noted   Labor and delivery, indication for care 06/07/2019   Round ligament pain 04/16/2019   IUGR (intrauterine growth restriction) affecting care of mother 01/28/2019   Atypical squamous cells cannot exclude high grade squamous intraepithelial lesion on cytologic smear of cervix (ASC-H) 12/24/2018   Encounter for supervision of normal pregnancy, antepartum 12/10/2018    Past Surgical History:  Procedure Laterality Date   TONSILLECTOMY       OB History     Gravida  2   Para  2   Term  2   Preterm      AB      Living  2      SAB      IAB      Ectopic      Multiple  0    Live Births  2           No family history on file.  Social History   Tobacco Use   Smoking status: Never   Smokeless tobacco: Never  Vaping Use   Vaping Use: Never used  Substance Use Topics   Alcohol use: No   Drug use: No    Home Medications Prior to Admission medications   Medication Sig Start Date End Date Taking? Authorizing Provider  acetaminophen (TYLENOL) 325 MG tablet Take 2 tablets (650 mg total) by mouth every 4 (four) hours as needed (for pain scale < 4). 06/09/19   Sparacino, Hailey L, DO  Blood Pressure KIT Monitor blood pressure at home regularly z34.90 standard size cuff 12/10/18   Lajean Manes, CNM  Elastic Bandages & Supports (COMFORT FIT MATERNITY SUPP SM) MISC 1 Units by Does not apply route daily as needed. 04/16/19   Laury Deep, CNM  ferrous sulfate 325 (65 FE) MG tablet Take 1 tablet (325 mg total) by mouth every other day. 06/10/19   Sparacino, Hailey L, DO  ibuprofen (ADVIL) 600 MG tablet Take 1 tablet (600 mg total) by mouth every 6 (six) hours. 06/09/19   Sparacino, Hailey L, DO  Prenatal Vit-Fe Phos-FA-Omega (VITAFOL GUMMIES) 3.33-0.333-34.8 MG CHEW Chew 3 tablets by mouth daily. 12/10/18   Lajean Manes, CNM    Allergies    Kiwi extract, Mushroom extract complex, and Pollen extract  Review of Systems   Review of Systems  Constitutional:  Negative for chills and fever.  Respiratory:  Negative for chest tightness.   Cardiovascular:  Negative for chest pain.  Gastrointestinal:  Negative for nausea and vomiting.  Musculoskeletal:  Negative for joint swelling.  Skin:  Positive for wound.  Allergic/Immunologic: Negative for immunocompromised state.  Neurological:  Negative for numbness.  Hematological:  Does not bruise/bleed easily.  Psychiatric/Behavioral:  The patient is nervous/anxious.    Physical Exam Updated Vital Signs BP 127/85   Pulse 95   Temp 98.9 F (37.2 C) (Oral)   Resp 16   LMP 04/26/2021   SpO2 99%   Physical  Exam Vitals and nursing note reviewed.  Constitutional:      General: She is not in acute distress.    Appearance: She is well-developed. She is not ill-appearing.  HENT:     Head: Normocephalic.  Eyes:     General: No scleral icterus.    Conjunctiva/sclera: Conjunctivae normal.  Cardiovascular:     Rate and Rhythm: Normal rate.  Pulmonary:     Effort: Pulmonary effort is normal.  Abdominal:     General: There is no distension.  Musculoskeletal:        General: Normal range of motion.     Cervical back: Normal range of motion.     Comments: Full ROM of all fingers of the right hand  Skin:    General: Skin is warm and dry.     Capillary Refill: Capillary refill takes less than 2 seconds.     Comments: 1.5 cm and 2.0 cm lacerations to the medial palm of the right hand  Neurological:     Mental Status: She is alert.     Comments: Sensation intact to touch  Psychiatric:        Mood and Affect: Mood normal.    ED Results / Procedures / Treatments   Labs (all labs ordered are listed, but only abnormal results are displayed) Labs Reviewed - No data to display  EKG None  Radiology No results found.  Procedures .Marland KitchenLaceration Repair  Date/Time: 05/01/2021 4:03 AM Performed by: Abigail Butts, PA-C Authorized by: Abigail Butts, PA-C   Consent:    Consent obtained:  Verbal   Consent given by:  Patient   Risks discussed:  Infection, need for additional repair, pain, poor cosmetic result and poor wound healing   Alternatives discussed:  No treatment and delayed treatment Universal protocol:    Procedure explained and questions answered to patient or proxy's satisfaction: yes     Relevant documents present and verified: yes     Test results available: yes     Imaging studies available: yes     Required blood products, implants, devices, and special equipment available: yes     Site/side marked: yes     Immediately prior to procedure, a time out was called:  yes     Patient identity confirmed:  Verbally with patient Anesthesia:    Anesthesia method:  Local infiltration and topical application   Topical anesthetic:  LET   Local anesthetic:  Lidocaine 2% WITH epi Laceration  details:    Location:  Hand   Hand location:  R palm   Length (cm):  1.5 Pre-procedure details:    Preparation:  Patient was prepped and draped in usual sterile fashion Exploration:    Hemostasis achieved with:  Epinephrine, direct pressure and LET   Wound exploration: entire depth of wound visualized   Treatment:    Area cleansed with:  Saline   Amount of cleaning:  Standard   Irrigation solution:  Sterile water   Irrigation method:  Syringe Skin repair:    Repair method:  Sutures   Suture size:  3-0   Suture material:  Nylon   Suture technique:  Simple interrupted   Number of sutures:  1 Approximation:    Approximation:  Close Repair type:    Repair type:  Simple Post-procedure details:    Dressing:  Non-adherent dressing   Procedure completion:  Tolerated well, no immediate complications .Marland KitchenLaceration Repair  Date/Time: 05/01/2021 4:04 AM Performed by: Abigail Butts, PA-C Authorized by: Abigail Butts, PA-C   Consent:    Consent obtained:  Verbal   Consent given by:  Patient   Risks discussed:  Infection, need for additional repair, pain, poor cosmetic result and poor wound healing   Alternatives discussed:  No treatment and delayed treatment Universal protocol:    Procedure explained and questions answered to patient or proxy's satisfaction: yes     Relevant documents present and verified: yes     Test results available: yes     Imaging studies available: yes     Required blood products, implants, devices, and special equipment available: yes     Site/side marked: yes     Immediately prior to procedure, a time out was called: yes     Patient identity confirmed:  Verbally with patient Anesthesia:    Anesthesia method:  Local  infiltration and topical application   Topical anesthetic:  LET   Local anesthetic:  Lidocaine 2% WITH epi Laceration details:    Location:  Hand   Hand location:  R palm   Length (cm):  2 Pre-procedure details:    Preparation:  Patient was prepped and draped in usual sterile fashion Exploration:    Hemostasis achieved with:  Epinephrine, direct pressure and LET   Wound exploration: entire depth of wound visualized   Treatment:    Area cleansed with:  Saline   Amount of cleaning:  Extensive   Irrigation solution:  Sterile water   Irrigation method:  Syringe Skin repair:    Repair method:  Sutures   Suture size:  3-0   Suture material:  Nylon   Suture technique:  Simple interrupted   Number of sutures:  2 Approximation:    Approximation:  Close Repair type:    Repair type:  Intermediate Post-procedure details:    Dressing:  Non-adherent dressing   Procedure completion:  Tolerated well, no immediate complications   Medications Ordered in ED Medications  lidocaine-EPINEPHrine-tetracaine (LET) topical gel (3 mLs Topical Given 05/01/21 0307)  lidocaine-EPINEPHrine (XYLOCAINE W/EPI) 2 %-1:200000 (PF) injection 20 mL (20 mLs Infiltration Given 05/01/21 0307)  Tdap (BOOSTRIX) injection 0.5 mL (0.5 mLs Intramuscular Given 05/01/21 1062)    ED Course  I have reviewed the triage vital signs and the nursing notes.  Pertinent labs & imaging results that were available during my care of the patient were reviewed by me and considered in my medical decision making (see chart for details).    MDM Rules/Calculators/A&P  Pressure irrigation performed. Wound explored and base of wound visualized in a bloodless field without evidence of foreign body.  Laceration occurred < 8 hours prior to repair which was well tolerated. Tdap updated.  Pt has no comorbidities to effect normal wound healing. Pt discharged without antibiotics.  Discussed suture home care with  patient and answered questions. Pt to follow-up for wound check and suture removal in 7 days; they are to return to the ED sooner for signs of infection. Pt is hemodynamically stable with no complaints prior to dc.     Final Clinical Impression(s) / ED Diagnoses Final diagnoses:  Laceration of right palm, initial encounter    Rx / DC Orders ED Discharge Orders     None        Kayleah Appleyard, Gwenlyn Perking 05/01/21 0408    Orpah Greek, MD 05/01/21 [phone]

## 2021-05-01 NOTE — Discharge Instructions (Signed)

## 2021-05-16 ENCOUNTER — Emergency Department (HOSPITAL_COMMUNITY)
Admission: EM | Admit: 2021-05-16 | Discharge: 2021-05-17 | Discharge status: Against Medical Advice | Payer: Medicaid Other | Source: Home / Self Care

## 2021-05-17 ENCOUNTER — Emergency Department (HOSPITAL_COMMUNITY)
Admission: EM | Admit: 2021-05-17 | Discharge: 2021-05-17 | Disposition: A | Discharge status: Home / Self Care | Payer: Medicaid Other | Attending: Emergency Medicine | Admitting: Emergency Medicine

## 2021-05-17 DIAGNOSIS — Z4802 Encounter for removal of sutures: Secondary | ICD-10-CM | POA: Insufficient documentation

## 2021-05-17 NOTE — Discharge Instructions (Signed)
It was a pleasure taking care of you today.  As discussed, your sutures were removed today.  Keep area clean.  You may take over-the-counter ibuprofen or Tylenol as needed for pain.  Please follow-up with PCP if there are any concerns about your wound.  Return to the ER for any worsening symptoms.

## 2021-05-17 NOTE — ED Provider Notes (Signed)
[facility] EMERGENCY DEPARTMENT Provider Note   CSN: [id number] Arrival date & time: 05/17/21  1627     History No chief complaint on file.   Tanya Rodgers is a 25 y.o. female with no significant past medical history who presents to the ED for suture removal.  Patient sustained a laceration to right palm on 10/31.  Patient returns today to have her sutures removed.  No purulent drainage.  Denies fever or chills.  Denies erythema to suture site.  No treatment prior to arrival.  No aggravating or alleviating factors.  History obtained from patient and past medical records. No interpreter used during encounter.       Past Medical History:  Diagnosis Date   GBS bacteriuria 02/23/2016   Will need prophylaxis in labor   Medical history non-contributory    Supervision of normal first pregnancy, antepartum 02/14/2016    Clinic  [facility] Prenatal Labs Dating  7 week sono Blood type: O/Positive/-- (08/15 1531) O pos Genetic Screen 1 Screen: nl NT   AFP: neg     Antibody:Negative (08/15 1531)neg Anatomic [place]  normal  Rubella: 1.96 (08/15 1531)Immune GTT  Third trimester: nl 2hr RPR: Non Reactive (08/15 1531) NR Flu vaccine  declined HBsAg: Negative (08/15 1531) NEg TDaP vaccine                                           Patient Active Problem List   Diagnosis Date Noted   Labor and delivery, indication for care 06/07/2019   Round ligament pain 04/16/2019   IUGR (intrauterine growth restriction) affecting care of mother 01/28/2019   Atypical squamous cells cannot exclude high grade squamous intraepithelial lesion on cytologic smear of cervix (ASC-H) 12/24/2018   Encounter for supervision of normal pregnancy, antepartum 12/10/2018    Past Surgical History:  Procedure Laterality Date   TONSILLECTOMY       OB History     Gravida  2   Para  2   Term  2   Preterm      AB      Living  2      SAB      IAB      Ectopic      Multiple  0   Live Births   2           No family history on file.  Social History   Tobacco Use   Smoking status: Never   Smokeless tobacco: Never  Vaping Use   Vaping Use: Never used  Substance Use Topics   Alcohol use: No   Drug use: No    Home Medications Prior to Admission medications   Medication Sig Start Date End Date Taking? Authorizing Provider  acetaminophen (TYLENOL) 325 MG tablet Take 2 tablets (650 mg total) by mouth every 4 (four) hours as needed (for pain scale < 4). 06/09/19   Sparacino, Hailey L, DO  Blood Pressure KIT Monitor blood pressure at home regularly z34.90 standard size cuff 12/10/18   Lajean Manes, CNM  Elastic Bandages & Supports (COMFORT FIT MATERNITY SUPP SM) MISC 1 Units by Does not apply route daily as needed. 04/16/19   Laury Deep, CNM  ferrous sulfate 325 (65 FE) MG tablet Take 1 tablet (325 mg total) by mouth every other day. 06/10/19   Sparacino, Hailey L, DO  ibuprofen (ADVIL) 600  MG tablet Take 1 tablet (600 mg total) by mouth every 6 (six) hours. 06/09/19   Sparacino, Hailey L, DO  Prenatal Vit-Fe Phos-FA-Omega (VITAFOL GUMMIES) 3.33-0.333-34.8 MG CHEW Chew 3 tablets by mouth daily. 12/10/18   Lajean Manes, CNM    Allergies    Kiwi extract, Mushroom extract complex, and Pollen extract  Review of Systems   Review of Systems  Constitutional:  Negative for chills and fever.  Skin:  Positive for wound.  All other systems reviewed and are negative.  Physical Exam Updated Vital Signs BP 115/74 (BP Location: Right Arm)   Pulse 92   Temp 98.6 F (37 C) (Oral)   Resp 16   LMP 04/26/2021   SpO2 97%   Physical Exam Vitals and nursing note reviewed.  Constitutional:      General: She is not in acute distress.    Appearance: She is not ill-appearing.  HENT:     Head: Normocephalic.  Eyes:     Pupils: Pupils are equal, round, and reactive to light.  Cardiovascular:     Rate and Rhythm: Normal rate and regular rhythm.     Pulses: Normal pulses.      Heart sounds: Normal heart sounds. No murmur heard.   No friction rub. No gallop.  Pulmonary:     Effort: Pulmonary effort is normal.     Breath sounds: Normal breath sounds.  Abdominal:     General: Abdomen is flat. There is no distension.     Palpations: Abdomen is soft.     Tenderness: There is no abdominal tenderness. There is no guarding or rebound.  Musculoskeletal:        General: Normal range of motion.     Cervical back: Neck supple.  Skin:    General: Skin is warm and dry.     Comments: Well-healing laceration to right palm.  3 sutures in place.  No purulent drainage.  Neurological:     General: No focal deficit present.     Mental Status: She is alert.  Psychiatric:        Mood and Affect: Mood normal.        Behavior: Behavior normal.    ED Results / Procedures / Treatments   Labs (all labs ordered are listed, but only abnormal results are displayed) Labs Reviewed - No data to display  EKG None  Radiology No results found.  Procedures Procedures   Medications Ordered in ED Medications - No data to display  ED Course  I have reviewed the triage vital signs and the nursing notes.  Pertinent labs & imaging results that were available during my care of the patient were reviewed by me and considered in my medical decision making (see chart for details).    MDM Rules/Calculators/A&P                           25 year old female presents to the ED due to suture removal.  Patient sustained a laceration to right palm on 10/31.  Sutures removed without complications.  No purulent drainage.  No signs of infection.  Patient stable for discharge. Strict ED precautions discussed with patient. Patient states understanding and agrees to plan. Patient discharged home in no acute distress and stable vitals  Final Clinical Impression(s) / ED Diagnoses Final diagnoses:  Visit for suture removal    Rx / DC Orders ED Discharge Orders     None  Suzy Bouchard, PA-C 05/17/21 1729    Carmin Muskrat, MD 05/17/21 [phone]

## 2021-11-22 ENCOUNTER — Ambulatory Visit: Payer: Medicaid Other | Admitting: Obstetrics

## 2022-08-02 ENCOUNTER — Encounter: Payer: Self-pay | Admitting: Obstetrics and Gynecology

## 2022-08-08 ENCOUNTER — Encounter: Payer: Self-pay | Admitting: Obstetrics and Gynecology

## 2022-08-08 ENCOUNTER — Ambulatory Visit (INDEPENDENT_AMBULATORY_CARE_PROVIDER_SITE_OTHER): Payer: Medicaid Other | Admitting: Obstetrics and Gynecology

## 2022-08-08 ENCOUNTER — Other Ambulatory Visit (HOSPITAL_COMMUNITY)
Admission: RE | Admit: 2022-08-08 | Discharge: 2022-08-08 | Disposition: A | Discharge status: Home / Self Care | Payer: Medicaid Other | Source: Ambulatory Visit | Attending: Obstetrics | Admitting: Obstetrics

## 2022-08-08 VITALS — BP 118/80 | HR 73 | Wt 157.0 lb

## 2022-08-08 DIAGNOSIS — Z3046 Encounter for surveillance of implantable subdermal contraceptive: Secondary | ICD-10-CM

## 2022-08-08 DIAGNOSIS — Z01419 Encounter for gynecological examination (general) (routine) without abnormal findings: Secondary | ICD-10-CM

## 2022-08-08 MED ORDER — VITAFOL ULTRA 29-0.6-0.4-200 MG PO CAPS: 1.0000 | ORAL_CAPSULE | Freq: Every day | ORAL | 11 refills | Status: AC

## 2022-08-08 NOTE — Patient Instructions (Signed)
The best days to conceive are the 1-2 days before you ovulate and the day you ovulate.  You can predict the day of ovulation in 4 ways: Tracking with a calendar. This works best if you have extremely regular menstrual cycles. Tracking changes to cervical mucus. You are most likely to conceive when cervical mucus is clear and slippery. Tracking your body temperature. This is less helpful because your body temperature doesn't rise until AFTER you ovulated (too late to time intercourse) It is ideal to use a special basal body temperature thermometer since it can pick up subtle differences. You take your temperature first thing in the morning - before you get out of bed, use the bathroom, or eat/drink anything. You will need to chart your temperature every day to be able to pick up on a meaningful rise. Tracking with ovulation predictor kits. These are urine tests that you do at home. They pick up a hormone in your urine called "LH" which spikes right before you ovulate. You should time intercourse to the day of the [facility] spike. You want to start testing in the 5 days before you think you'll ovulate, but the kits usually come with instructions that explain when to use them.  If you are trying to get pregnant, take a prenatal vitamin every day. They work best to help your baby's brain & spinal cord growth if you're taking them BEFORE you get pregnant.

## 2022-08-08 NOTE — Progress Notes (Signed)
Nexplanon placed 06/08/2019 after delivery.

## 2022-08-08 NOTE — Progress Notes (Signed)
ANNUAL EXAM Patient name: Tanya Rodgers MRN [id number]  Date of birth: [birth year] Chief Complaint:   Annual Exam and Contraception  History of Present Illness:   Tanya Rodgers is a 27 y.o. [phone] with No LMP recorded. being seen today for a routine annual exam.  Current complaints: Desires Nexplanon removal   Upstream - 08/08/22 1011       Pregnancy Intention Screening   Does the patient want to become pregnant in the next year? Yes    Does the patient's partner want to become pregnant in the next year? N/A    Would the patient like to discuss contraceptive options today? Yes      Contraception Wrap Up   Current Method Hormonal Implant    End Method No Contraception Precautions    Contraception Counseling Provided Yes    How was the end contraceptive method provided? N/A            The pregnancy intention screening data noted above was reviewed. Potential methods of contraception were discussed. The patient elected to proceed with No Contraception Precautions.   Last pap 12/10/2018. Results were: ASC-H w/ HRHPV negative. She did not complete her colposcopy and was lost to follow up Last mammogram: n/a Last colonoscopy: n/a HPV vaccine: pt unsure     08/08/2022   10:11 AM  Depression screen PHQ 2/9  Decreased Interest 0  Down, Depressed, Hopeless 0  PHQ - 2 Score 0         No data to display         Review of Systems:   Pertinent items are noted in HPI Denies any headaches, blurred vision, fatigue, shortness of breath, chest pain, abdominal pain, abnormal vaginal discharge/itching/odor/irritation, problems with periods, bowel movements, urination, or intercourse unless otherwise stated above. Pertinent History Reviewed:  Reviewed past medical,surgical, social and family history.  Reviewed problem list, medications and allergies. Physical Assessment:   Vitals:   08/08/22 1007  BP: 118/80  Pulse: 73  Weight: 157 lb (71.2 kg)  Body mass index is  29.66 kg/m.        Physical Examination:   General appearance - well appearing, and in no distress  Mental status - alert, oriented to person, place, and time  Chest - respiratory effort normal  Heart - normal peripheral perfusion  Breasts - breasts appear normal, no suspicious masses, no skin or nipple changes or axillary nodes  Abdomen - soft, nontender, nondistended, no masses or organomegaly  Pelvic - VULVA: normal appearing vulva with no masses, tenderness or lesions  VAGINA: normal appearing vagina with normal color and discharge, no lesions  CERVIX: normal appearing cervix without discharge or lesions, no CMT  Thin prep pap is done with reflex HR HPV cotesting  UTERUS: uterus is felt to be normal size, shape, consistency and nontender   ADNEXA: No adnexal masses or tenderness noted.  Chaperone present for exam  No results found for this or any previous visit (from the past 24 hour(s)).  Assessment & Plan:  1) Well-Woman Exam Mammogram: @ 26yo, or sooner if problems Colonoscopy: @ 26yo, or sooner if problems Pap: Collected - discussed abnormal pap in 2020 and need for close follow up if pap abnormal today Gardasil: Pt to reach out to PCP/pediatrician for records GC/CT: Collected HIV/HCV: Collected  2) Encounter for Nexplanon removal Desires pregnancy Nexplanon removed, see procedure note below Rx provided for PNV  Labs/procedures today:   Orders Placed This Encounter  Procedures  HIV Antibody (routine testing w rflx)   Hepatitis B surface antigen   RPR   Hepatitis C antibody   Meds:  Meds ordered this encounter  Medications   Prenat-Fe Poly-Methfol-FA-DHA (VITAFOL ULTRA) 29-0.6-0.4-200 MG CAPS    Sig: Take 1 capsule by mouth daily.    Dispense:  30 capsule    Refill:  11    Follow-up: Return for pending pap results.  Inez Catalina, MD 08/08/2022 11:33 AM    GYNECOLOGY OFFICE PROCEDURE NOTE  Tanya Rodgers is a 27 y.o. [phone] here for Nexplanon  removal.    Nexplanon Removal Patient identified, informed consent performed, consent signed.   Appropriate time out taken.   Nexplanon site identified.  Area prepped in usual sterile fashon. One ml of 1% lidocaine was used to anesthetize the area at the distal end of the implant. A small stab incision was made right beside the implant on the distal portion. A combination of hemostats and pop out technique used to remove the Nexplanon.  There was minimal blood loss. There were no complications.  Steri-strips were applied over the small incision.  A pressure bandage was applied to reduce any bruising.    The patient tolerated the procedure well and was given post procedure instructions.  Patient is planning to attempt conception".  Gale Journey, MD [place], [facility] for [employer], [place]

## 2022-08-09 LAB — HEPATITIS B SURFACE ANTIGEN: Hepatitis B Surface Ag: NEGATIVE

## 2022-08-09 LAB — CERVICOVAGINAL ANCILLARY ONLY
Chlamydia: NEGATIVE
Comment: NEGATIVE
Comment: NEGATIVE
Comment: NORMAL
Neisseria Gonorrhea: NEGATIVE
Trichomonas: NEGATIVE

## 2022-08-09 LAB — HEPATITIS C ANTIBODY: Hep C Virus Ab: NONREACTIVE

## 2022-08-09 LAB — RPR: RPR Ser Ql: NONREACTIVE

## 2022-08-09 LAB — HIV ANTIBODY (ROUTINE TESTING W REFLEX): HIV Screen 4th Generation wRfx: NONREACTIVE

## 2022-08-12 ENCOUNTER — Ambulatory Visit (HOSPITAL_COMMUNITY)
Admission: EM | Admit: 2022-08-12 | Discharge: 2022-08-12 | Disposition: A | Discharge status: Home / Self Care | Payer: Medicaid Other | Attending: Nurse Practitioner | Admitting: Nurse Practitioner

## 2022-08-12 ENCOUNTER — Encounter (HOSPITAL_COMMUNITY): Payer: Self-pay

## 2022-08-12 DIAGNOSIS — N939 Abnormal uterine and vaginal bleeding, unspecified: Secondary | ICD-10-CM | POA: Diagnosis present

## 2022-08-12 LAB — CBC WITH DIFFERENTIAL/PLATELET
Abs Immature Granulocytes: 0.03 10*3/uL (ref 0.00–0.07)
Basophils Absolute: 0 10*3/uL (ref 0.0–0.1)
Basophils Relative: 0 %
Eosinophils Absolute: 0.1 10*3/uL (ref 0.0–0.5)
Eosinophils Relative: 1 %
HCT: 36.5 % (ref 36.0–46.0)
Hemoglobin: 12.7 g/dL (ref 12.0–15.0)
Immature Granulocytes: 0 %
Lymphocytes Relative: 37 %
Lymphs Abs: 3.7 10*3/uL (ref 0.7–4.0)
MCH: 31.6 pg (ref 26.0–34.0)
MCHC: 34.8 g/dL (ref 30.0–36.0)
MCV: 90.8 fL (ref 80.0–100.0)
Monocytes Absolute: 0.6 10*3/uL (ref 0.1–1.0)
Monocytes Relative: 6 %
Neutro Abs: 5.4 10*3/uL (ref 1.7–7.7)
Neutrophils Relative %: 56 %
Platelets: 383 10*3/uL (ref 150–400)
RBC: 4.02 MIL/uL (ref 3.87–5.11)
RDW: 11.9 % (ref 11.5–15.5)
WBC: 9.8 10*3/uL (ref 4.0–10.5)
nRBC: 0 % (ref 0.0–0.2)

## 2022-08-12 NOTE — ED Triage Notes (Signed)
Pt states she had her birth control nexplanon removed 2-3 days ago. She has started passing blood clots since then. Pt denies soaking 1-2 per hour. Pt denies any lightlessness or dizziness at this time.

## 2022-08-12 NOTE — Discharge Instructions (Addendum)
Your vaginal bleeding is most likely due to having your nexplanon removed.   We have checked your blood count (hemoglobin) levels. You will only be notified if it is abnormal. You may check MyChart to review the results.   Drink enough fluid to keep your urine pale yellow. Eat foods that are high in fiber, such as beans, whole grains, and fresh fruits and vegetables. Limit foods that are high in fat and processed sugars, such as fried or sweet foods. Place a cold pack on your lower abdomen. Rest as much as possible until the bleeding stops or slows down. Do not use tampons, douche, or have sex until your health care provider says these things are okay. Change your sanitary pads often.  Go to the ED immediately if:  You faint. You have bleeding that soaks through a sanitary pad every hour. You have severe pain in the abdomen. You have a fever or chills. You become sweaty or weak. You pass large blood clots from your vagina.

## 2022-08-12 NOTE — ED Provider Notes (Signed)
[facility]    CSN: [id number] Arrival date & time: 08/12/22  1709      History   Chief Complaint Chief Complaint  Patient presents with   Vaginal Bleeding    HPI Tanya Rodgers is a 27 y.o. female.   Subjective:   Tanya Rodgers is a 27 y.o. female who presents for evaluation of vaginal bleeding described as similar to period and passing medium (quarter sized) clots, using 5 tampons every hour since onset. Symptoms have been present for 3 days.  She also endorses mild headache, dizziness and lower abdominal cramping for the past 2 days. She had her Nexplanon removed 4 days ago and the bleeding started the next day. She is sexually active and uses condoms. She desires to become pregnant soon which is why she had the nexplanon removed.   The following portions of the patient's history were reviewed and updated as appropriate: allergies, current medications, past family history, past medical history, past social history, past surgical history, and problem list.         Past Medical History:  Diagnosis Date   GBS bacteriuria 02/23/2016   Will need prophylaxis in labor   Medical history non-contributory    Supervision of normal first pregnancy, antepartum 02/14/2016    Clinic  [facility] Prenatal Labs Dating  7 week sono Blood type: O/Positive/-- (08/15 1531) O pos Genetic Screen 1 Screen: nl NT   AFP: neg     Antibody:Negative (08/15 1531)neg Anatomic [place]  normal  Rubella: 1.96 (08/15 1531)Immune GTT  Third trimester: nl 2hr RPR: Non Reactive (08/15 1531) NR Flu vaccine  declined HBsAg: Negative (08/15 1531) NEg TDaP vaccine                                          Vaginal Pap smear, abnormal     Patient Active Problem List   Diagnosis Date Noted   Atypical squamous cells cannot exclude high grade squamous intraepithelial lesion on cytologic smear of cervix (ASC-H) 12/24/2018    Past Surgical History:  Procedure Laterality Date   TONSILLECTOMY      OB  History     Gravida  2   Para  2   Term  2   Preterm      AB      Living  2      SAB      IAB      Ectopic      Multiple  0   Live Births  2        Obstetric Comments  G2 - FGR          Home Medications    Prior to Admission medications   Medication Sig Start Date End Date Taking? Authorizing Provider  acetaminophen (TYLENOL) 325 MG tablet Take 2 tablets (650 mg total) by mouth every 4 (four) hours as needed (for pain scale < 4). 06/09/19   Sparacino, Hailey L, DO  ibuprofen (ADVIL) 600 MG tablet Take 1 tablet (600 mg total) by mouth every 6 (six) hours. 06/09/19   Sparacino, Hailey L, DO  Prenat-Fe Poly-Methfol-FA-DHA (VITAFOL ULTRA) 29-0.6-0.4-200 MG CAPS Take 1 capsule by mouth daily. 08/08/22   Inez Catalina, MD    Family History History reviewed. No pertinent family history.  Social History Social History   Tobacco Use   Smoking status: Never   Smokeless tobacco:  Never  Vaping Use   Vaping Use: Never used  Substance Use Topics   Alcohol use: No   Drug use: No     Allergies   Kiwi extract, Mushroom extract complex, and Pollen extract   Review of Systems Review of Systems  Constitutional:  Negative for appetite change.  Gastrointestinal:  Negative for nausea and vomiting.  Genitourinary:  Positive for vaginal bleeding. Negative for vaginal discharge and vaginal pain.  Neurological:  Positive for dizziness and headaches.     Physical Exam Triage Vital Signs ED Triage Vitals [08/12/22 1717]  Enc Vitals Group     BP (!) 106/57     Pulse Rate 67     Resp 16     Temp 98.1 F (36.7 C)     Temp Source Oral     SpO2 98 %     Weight      Height      Head Circumference      Peak Flow      Pain Score      Pain Loc      Pain Edu?      Excl. in [place]?    No data found.  Updated Vital Signs BP (!) 106/57 (BP Location: Left Arm)   Pulse 67   Temp 98.1 F (36.7 C) (Oral)   Resp 16   LMP 08/12/2022   SpO2 98%   Visual  Acuity Right Eye Distance:   Left Eye Distance:   Bilateral Distance:    Right Eye Near:   Left Eye Near:    Bilateral Near:     Physical Exam Vitals reviewed.  Constitutional:      Appearance: Normal appearance. She is normal weight.  HENT:     Head: Normocephalic.     Mouth/Throat:     Mouth: Mucous membranes are moist.  Genitourinary:    Comments: Deferred  Musculoskeletal:        General: Normal range of motion.     Cervical back: Normal range of motion and neck supple.  Skin:    General: Skin is warm and dry.  Neurological:     General: No focal deficit present.     Mental Status: She is alert and oriented to person, place, and time.      UC Treatments / Results  Labs (all labs ordered are listed, but only abnormal results are displayed) Labs Reviewed  CBC WITH DIFFERENTIAL/PLATELET    EKG   Radiology No results found.  Procedures Procedures (including critical care time)  Medications Ordered in UC Medications - No data to display  Initial Impression / Assessment and Plan / UC Course  I have reviewed the triage vital signs and the nursing notes.  Pertinent labs & imaging results that were available during my care of the patient were reviewed by me and considered in my medical decision making (see chart for details).    27 yo female presenting with vaginal bleeding after having her Nexplanon removed. She is afebrile and nontoxic appearing. VSS. No focal deficits noted on exam. Vaginal exam deferred. Bleeding likely due to having nexplanon removed. CBC pending. Advised supportive care measures and to follow-up with her gynecologist tomorrow. ED precautions extensively reviewed with patient.   Today's evaluation has revealed no signs of a dangerous process. Discussed diagnosis with patient and/or guardian. Patient and/or guardian aware of their diagnosis, possible red flag symptoms to watch out for and need for close follow up. Patient and/or guardian  understands verbal and  written discharge instructions. Patient and/or guardian comfortable with plan and disposition.  Patient and/or guardian has a clear mental status at this time, good insight into illness (after discussion and teaching) and has clear judgment to make decisions regarding their care  Documentation was completed with the aid of voice recognition software. Transcription may contain typographical errors. Final Clinical Impressions(s) / UC Diagnoses   Final diagnoses:  Vaginal bleeding     Discharge Instructions      Your vaginal bleeding is most likely due to having your nexplanon removed.   We have checked your blood count (hemoglobin) levels. You will only be notified if it is abnormal. You may check MyChart to review the results.   Drink enough fluid to keep your urine pale yellow. Eat foods that are high in fiber, such as beans, whole grains, and fresh fruits and vegetables. Limit foods that are high in fat and processed sugars, such as fried or sweet foods. Place a cold pack on your lower abdomen. Rest as much as possible until the bleeding stops or slows down. Do not use tampons, douche, or have sex until your health care provider says these things are okay. Change your sanitary pads often.  Go to the ED immediately if:  You faint. You have bleeding that soaks through a sanitary pad every hour. You have severe pain in the abdomen. You have a fever or chills. You become sweaty or weak. You pass large blood clots from your vagina.     ED Prescriptions   None    PDMP not reviewed this encounter.   Enrique Sack, [place] 08/12/22 [phone]

## 2022-08-15 LAB — CYTOLOGY - PAP
Comment: NEGATIVE
Diagnosis: UNDETERMINED — AB
High risk HPV: NEGATIVE

## 2022-09-25 ENCOUNTER — Encounter: Payer: Medicaid Other | Admitting: Obstetrics and Gynecology

## 2022-10-18 ENCOUNTER — Other Ambulatory Visit: Payer: Self-pay

## 2022-10-18 ENCOUNTER — Emergency Department (HOSPITAL_COMMUNITY): Payer: Medicaid Other

## 2022-10-18 ENCOUNTER — Encounter (HOSPITAL_COMMUNITY): Payer: Self-pay | Admitting: Emergency Medicine

## 2022-10-18 ENCOUNTER — Emergency Department (HOSPITAL_COMMUNITY)
Admission: EM | Admit: 2022-10-18 | Discharge: 2022-10-19 | Discharge status: Against Medical Advice | Payer: Medicaid Other | Attending: Emergency Medicine | Admitting: Emergency Medicine

## 2022-10-18 DIAGNOSIS — R079 Chest pain, unspecified: Secondary | ICD-10-CM | POA: Insufficient documentation

## 2022-10-18 DIAGNOSIS — Z5321 Procedure and treatment not carried out due to patient leaving prior to being seen by health care provider: Secondary | ICD-10-CM | POA: Insufficient documentation

## 2022-10-18 DIAGNOSIS — R0602 Shortness of breath: Secondary | ICD-10-CM | POA: Diagnosis not present

## 2022-10-18 LAB — BASIC METABOLIC PANEL
Anion gap: 13 (ref 5–15)
BUN: 7 mg/dL (ref 6–20)
CO2: 23 mmol/L (ref 22–32)
Calcium: 9.4 mg/dL (ref 8.9–10.3)
Chloride: 103 mmol/L (ref 98–111)
Creatinine, Ser: 0.75 mg/dL (ref 0.44–1.00)
GFR, Estimated: >60 mL/min (ref >60)
Glucose, Bld: 84 mg/dL (ref 70–99)
Potassium: 3.6 mmol/L (ref 3.5–5.1)
Sodium: 139 mmol/L (ref 135–145)

## 2022-10-18 LAB — TROPONIN I (HIGH SENSITIVITY): Troponin I (High Sensitivity): <2 ng/L (ref <18)

## 2022-10-18 LAB — CBC
HCT: 33.9 % — ABNORMAL LOW (ref 36.0–46.0)
Hemoglobin: 11.3 g/dL — ABNORMAL LOW (ref 12.0–15.0)
MCH: 30.8 pg (ref 26.0–34.0)
MCHC: 33.3 g/dL (ref 30.0–36.0)
MCV: 92.4 fL (ref 80.0–100.0)
Platelets: 400 10*3/uL (ref 150–400)
RBC: 3.67 MIL/uL — ABNORMAL LOW (ref 3.87–5.11)
RDW: 11.7 % (ref 11.5–15.5)
WBC: 10.5 10*3/uL (ref 4.0–10.5)
nRBC: 0 % (ref 0.0–0.2)

## 2022-10-18 NOTE — ED Triage Notes (Signed)
Pt c/o non radiating centralized chest pain described as sharp that started this morning. Endorses shortness of breath.

## 2022-10-18 NOTE — ED Notes (Signed)
Pt wanted to leaVE AND CHECK MY CHART FOR ANY RESULTS BUT DID NOT WANT TO WAIT ANY LONGER
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
## 2023-01-08 ENCOUNTER — Encounter: Payer: Self-pay | Admitting: Obstetrics and Gynecology

## 2023-01-08 ENCOUNTER — Ambulatory Visit (INDEPENDENT_AMBULATORY_CARE_PROVIDER_SITE_OTHER): Payer: Medicaid Other | Admitting: Obstetrics and Gynecology

## 2023-01-08 VITALS — BP 109/73 | HR 87 | Ht 61.0 in | Wt 166.0 lb

## 2023-01-08 DIAGNOSIS — Z30017 Encounter for initial prescription of implantable subdermal contraceptive: Secondary | ICD-10-CM | POA: Diagnosis not present

## 2023-01-08 LAB — POCT URINE PREGNANCY: Preg Test, Ur: NEGATIVE

## 2023-01-08 MED ORDER — ETONOGESTREL 68 MG ~~LOC~~ IMPL
68.0000 mg | DRUG_IMPLANT | Freq: Once | SUBCUTANEOUS | Status: AC
  Administered 2023-01-08: 68 mg via SUBCUTANEOUS

## 2023-01-08 NOTE — Progress Notes (Addendum)
27 y.o GYN presents for Nexplanon Insertion.  Last sexual intercourse was more than 3 weeks ago.  UPT Today is Negative   Administrations This Visit     etonogestrel (NEXPLANON) implant 68 mg     Admin Date 01/08/2023 Action Given Dose 68 mg Route Subdermal Administered By Maretta Bees, RMA

## 2023-01-08 NOTE — Progress Notes (Signed)
     GYNECOLOGY OFFICE PROCEDURE NOTE  Tanya Rodgers is a 27 y.o. [phone] here for Nexplanon insertion.  Last pap smear was on 2/24 and was ASCUS with negative high risk HPV.  No other gynecologic concerns.  Nexplanon Insertion Procedure Patient identified, informed consent performed, consent signed.   Patient does understand that irregular bleeding is a very common side effect of this medication. She was advised to have backup contraception for one week after placement. Pregnancy test in clinic today was negative.  Appropriate time out taken.  Patient's left arm was prepped and draped in the usual sterile fashion. The ruler used to measure and mark insertion area.  Patient was prepped with alcohol swab and then injected with 3 ml of 1% lidocaine.  She was prepped with betadine, Nexplanon removed from packaging,  Device confirmed in needle, then inserted full length of needle and withdrawn per handbook instructions. Nexplanon was able to palpated in the patient's arm; patient palpated the insert herself. There was minimal blood loss.  Patient insertion site covered with guaze and a pressure bandage to reduce any bruising.  The patient tolerated the procedure well and was given post procedure instructions.     Mariel Aloe, MD, FACOG Obstetrician & Gynecologist, [facility] for [facility], [facility]

## 2023-11-06 ENCOUNTER — Other Ambulatory Visit (HOSPITAL_COMMUNITY)
Admission: RE | Admit: 2023-11-06 | Discharge: 2023-11-06 | Disposition: A | Discharge status: Home / Self Care | Source: Ambulatory Visit | Attending: Advanced Practice Midwife | Admitting: Advanced Practice Midwife

## 2023-11-06 ENCOUNTER — Ambulatory Visit: Admitting: Advanced Practice Midwife

## 2023-11-06 ENCOUNTER — Encounter: Payer: Self-pay | Admitting: Advanced Practice Midwife

## 2023-11-06 VITALS — BP 132/75 | HR 92 | Ht 61.0 in | Wt 175.0 lb

## 2023-11-06 DIAGNOSIS — R87611 Atypical squamous cells cannot exclude high grade squamous intraepithelial lesion on cytologic smear of cervix (ASC-H): Secondary | ICD-10-CM

## 2023-11-06 DIAGNOSIS — Z01419 Encounter for gynecological examination (general) (routine) without abnormal findings: Secondary | ICD-10-CM | POA: Diagnosis present

## 2023-11-06 DIAGNOSIS — Z3202 Encounter for pregnancy test, result negative: Secondary | ICD-10-CM | POA: Diagnosis not present

## 2023-11-06 DIAGNOSIS — Z113 Encounter for screening for infections with a predominantly sexual mode of transmission: Secondary | ICD-10-CM

## 2023-11-06 LAB — POCT URINE PREGNANCY: Preg Test, Ur: NEGATIVE

## 2023-11-06 NOTE — Progress Notes (Signed)
 Pt is concerned about having a yeast infection. No itching.  Would like to discuss possible treatments.   No other concerns at this time.

## 2023-11-06 NOTE — Progress Notes (Signed)
 Subjective:     Tanya Rodgers is a 28 y.o. female here at [facility] for a routine exam.  Current complaints: none. Pt reports her s/o had symptoms of infection and is taking an antibiotic (maybe Flagyl ) but it is not an STD.  He recommended she take abx too. She was recently treated for a yeast infection and wonders if that could have caused his symptoms.  She reports they are monogamous.  Personal and family health history reviewed: yes.  Do you have a primary care provider? yes Do you feel safe at home? yes  Flowsheet Row Office Visit from 08/08/2022 in [facility] for [facility] at [facility] Total Score 0       Health Maintenance Due  Topic Date Due   HPV VACCINES (1 - 3-dose series) Never done   COVID-19 Vaccine (1 - 2024-25 season) Never done     Risk factors for chronic health problems: Smoking: Alchohol/how much: Pt BMI: Body mass index is 33.07 kg/m.   Gynecologic History No LMP recorded. Contraception: Nexplanon  Last Pap: 08/2022. Results were: abnormal, ASC-H. Colpo was recommended. Last mammogram: n/a.   Obstetric History OB History  Gravida Para Term Preterm AB Living  2 2 2   2   SAB IAB Ectopic Multiple Live Births     0 2    # Outcome Date GA Lbr Len/2nd Weight Sex Type Anes PTL Lv  2 Term 06/07/19 [redacted]w[redacted]d 12:16 / 00:06 4 lb 3.7 oz (1.919 kg) M Vag-Spont EPI  LIV     Birth Comments: WNL  1 Term 09/04/16 [redacted]w[redacted]d   F Vag-Vacuum EPI  LIV    Obstetric Comments  G2 - FGR     The following portions of the patient's history were reviewed and updated as appropriate: allergies, current medications, past family history, past medical history, past social history, past surgical history, and problem list.  Review of Systems Pertinent items noted in HPI and remainder of comprehensive ROS otherwise negative.    Objective:   Today's Vitals   11/06/23 1408  BP: 132/75  Pulse: 92  Weight: 175 lb (79.4 kg)  Height: 5\' 1"  (1.549 m)   Body  mass index is 33.07 kg/m.  VS reviewed, nursing note reviewed,  Constitutional: well developed, well nourished, no distress HEENT: normocephalic, thyroid without enlargement or mass HEART: RRR, no murmurs rubs/gallops RESP: clear and equal to auscultation bilaterally in all lobes  Breast Exam:  exam performed: right breast normal without mass, skin or nipple changes or axillary nodes, left breast normal without mass, skin or nipple changes or axillary nodes Abdomen: soft Neuro: alert and oriented x 3 Skin: warm, dry Psych: affect normal Pelvic exam: Performed: Cervix pink, visually closed, without lesion, scant white creamy discharge, vaginal walls and external genitalia normal Bimanual exam: Cervix 0/long/high, firm, anterior, neg CMT, uterus nontender, nonenlarged, adnexa without tenderness, enlargement, or mass        Assessment/Plan:   1. Encounter for annual routine gynecological examination (Primary) --Nexplanon  in place, no concerns --Discussed pt partner's symptoms.  I do not recommend any treatment for her unless she knows what he is being treated for.  If it is a UTI, nothing is recommended. Treating bacteria for men is uncommon unless it is an STD so I am concerned.  Maybe the PCP is treating him with an abundance of caution and his tests were negative.  I recommend she discuss with him the results of his testing.   --Pt  STI screening test pending  2. Atypical squamous cells cannot exclude high grade squamous intraepithelial lesion on cytologic smear of cervix (ASC-H) --Colpo recommended after pap in 2024, pt concerned about pain.   --Since it has been >1 year, will repeat Pap today. If colpo recommended again, pt would like pre-medication before colpo procedure.    - Cytology - PAP( [place])  3. Screening examination for STI - Cervicovaginal ancillary only( [place])  --Pt had serum testing in the last 3 months   Return in about 1 year (around 11/05/2024).   Arlester Bence, CNM 4:00 PM

## 2023-11-07 LAB — CERVICOVAGINAL ANCILLARY ONLY
Bacterial Vaginitis (gardnerella): POSITIVE — AB
Candida Glabrata: NEGATIVE
Candida Vaginitis: NEGATIVE
Chlamydia: POSITIVE — AB
Comment: NEGATIVE
Comment: NEGATIVE
Comment: NEGATIVE
Comment: NEGATIVE
Comment: NEGATIVE
Comment: NORMAL
Neisseria Gonorrhea: NEGATIVE
Trichomonas: NEGATIVE

## 2023-11-08 ENCOUNTER — Other Ambulatory Visit: Payer: Self-pay

## 2023-11-08 MED ORDER — FLUCONAZOLE 150 MG PO TABS: 150.0000 mg | ORAL_TABLET | Freq: Once | ORAL | 0 refills | Status: AC

## 2023-11-08 MED ORDER — DOXYCYCLINE HYCLATE 100 MG PO CAPS: 100.0000 mg | ORAL_CAPSULE | Freq: Two times a day (BID) | ORAL | 0 refills | Status: AC

## 2023-11-11 ENCOUNTER — Telehealth: Payer: Self-pay | Admitting: *Deleted

## 2023-11-11 LAB — CYTOLOGY - PAP
Comment: NEGATIVE
Diagnosis: UNDETERMINED — AB
High risk HPV: POSITIVE — AB

## 2023-11-11 NOTE — Telephone Encounter (Signed)
 RTC regarding vomiting when taking doxycycline. Pt reports taking med on empty stomach and then vomiting a little while later. Does not see pill fragments. Advised to take with small amt of food like crackers.

## 2023-11-12 ENCOUNTER — Ambulatory Visit: Payer: Self-pay | Admitting: Advanced Practice Midwife

## 2023-11-19 MED ORDER — LORAZEPAM 2 MG PO TABS: ORAL_TABLET | ORAL | 0 refills | Status: AC

## 2023-12-27 ENCOUNTER — Ambulatory Visit

## 2023-12-27 ENCOUNTER — Other Ambulatory Visit (HOSPITAL_COMMUNITY)
Admission: RE | Admit: 2023-12-27 | Discharge: 2023-12-27 | Disposition: A | Discharge status: Home / Self Care | Source: Ambulatory Visit | Attending: Advanced Practice Midwife | Admitting: Advanced Practice Midwife

## 2023-12-27 VITALS — BP 132/86 | HR 79 | Wt 173.0 lb

## 2023-12-27 DIAGNOSIS — Z113 Encounter for screening for infections with a predominantly sexual mode of transmission: Secondary | ICD-10-CM | POA: Diagnosis present

## 2023-12-27 NOTE — Progress Notes (Signed)
 [id number] Q -- Medicaid Number pt would like updated in chart, per pt.  SUBJECTIVE:  28 y.o. female who desires a STI screen. Here for TOC for + Chlamydia from 11/06/23.  Denies abnormal vaginal discharge, bleeding or significant pelvic pain. No UTI symptoms. Denies history of known exposure to STD.  Patient's last menstrual period was 12/23/2023.  OBJECTIVE:  She appears well.   ASSESSMENT:  STI Screen   PLAN:  Pt offered STI blood screening-declined GC, chlamydia, and trichomonas probe sent to lab.  Treatment: To be determined once lab results are received.  Pt follow up as needed.

## 2023-12-31 ENCOUNTER — Telehealth: Payer: Self-pay | Admitting: *Deleted

## 2023-12-31 LAB — CERVICOVAGINAL ANCILLARY ONLY
Bacterial Vaginitis (gardnerella): POSITIVE — AB
Candida Glabrata: NEGATIVE
Candida Vaginitis: POSITIVE — AB
Chlamydia: NEGATIVE
Comment: NEGATIVE
Comment: NEGATIVE
Comment: NEGATIVE
Comment: NEGATIVE
Comment: NEGATIVE
Comment: NORMAL
Neisseria Gonorrhea: NEGATIVE
Trichomonas: NEGATIVE

## 2023-12-31 NOTE — Telephone Encounter (Signed)
 RTC regarding pt question about records. No answer. Left HIPAA compliant VM with info on ROI forms and call back number if that does not answer her questions.

## 2024-01-01 ENCOUNTER — Ambulatory Visit: Payer: Self-pay | Admitting: Obstetrics and Gynecology

## 2024-01-01 MED ORDER — FLUCONAZOLE 150 MG PO TABS: 150.0000 mg | ORAL_TABLET | Freq: Once | ORAL | 0 refills | Status: AC

## 2024-02-23 ENCOUNTER — Ambulatory Visit (HOSPITAL_COMMUNITY)
Admission: EM | Admit: 2024-02-23 | Discharge: 2024-02-23 | Disposition: A | Discharge status: Home / Self Care | Attending: Emergency Medicine | Admitting: Emergency Medicine

## 2024-02-23 ENCOUNTER — Encounter (HOSPITAL_COMMUNITY): Payer: Self-pay

## 2024-02-23 DIAGNOSIS — M25571 Pain in right ankle and joints of right foot: Secondary | ICD-10-CM | POA: Diagnosis not present

## 2024-02-23 DIAGNOSIS — S29011A Strain of muscle and tendon of front wall of thorax, initial encounter: Secondary | ICD-10-CM | POA: Diagnosis not present
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
## 2024-02-23 NOTE — Discharge Instructions (Addendum)
 I suspect that your pains are likely muscular in nature. I recommend the following to help with relief.   Rest Warm compresses to the area (20 minutes on, minimum of 30 minutes off) You can alternate Tylenol  and Ibuprofen  for pain management but Ibuprofen  is typically preferred to reduce inflammation.   Gentle stretches and exercises that I have included in your paperwork Try to reduce excess strain to the area and rest as much as possible  Wear supportive shoes and, if you must lift anything, use proper lifting techniques that spare your back.

## 2024-02-23 NOTE — ED Triage Notes (Addendum)
 Patient c/o right foot and ankle pain and swelling  since yesterday. Patient denies any injury. Patient states she has numbness to the right foot and ankle as well.  Patient also c/o headache and right chest pain that started yesterday. Patient states  symptoms are less today than yesterday.   Patient states she has been taking Ibuprofen  for her symptoms.

## 2024-07-17 ENCOUNTER — Ambulatory Visit (INDEPENDENT_AMBULATORY_CARE_PROVIDER_SITE_OTHER): Admitting: Radiology

## 2024-07-17 ENCOUNTER — Encounter: Payer: Self-pay | Admitting: Emergency Medicine

## 2024-07-17 ENCOUNTER — Ambulatory Visit
Admission: EM | Admit: 2024-07-17 | Discharge: 2024-07-17 | Disposition: A | Discharge status: Home / Self Care | Attending: Emergency Medicine | Admitting: Emergency Medicine

## 2024-07-17 DIAGNOSIS — M545 Low back pain, unspecified: Secondary | ICD-10-CM

## 2024-07-17 DIAGNOSIS — S161XXA Strain of muscle, fascia and tendon at neck level, initial encounter: Secondary | ICD-10-CM | POA: Diagnosis not present

## 2024-07-17 LAB — POCT URINE PREGNANCY: Preg Test, Ur: NEGATIVE

## 2024-07-17 MED ORDER — IBUPROFEN 800 MG PO TABS
800.0000 mg | ORAL_TABLET | Freq: Once | ORAL | Status: AC
  Administered 2024-07-17: 800 mg via ORAL

## 2024-07-17 MED ORDER — METHOCARBAMOL 500 MG PO TABS: 500.0000 mg | ORAL_TABLET | Freq: Two times a day (BID) | ORAL | 0 refills | Status: AC

## 2024-07-17 MED ORDER — NAPROXEN 500 MG PO TABS: 500.0000 mg | ORAL_TABLET | Freq: Two times a day (BID) | ORAL | 0 refills | Status: AC
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
## 2024-07-17 NOTE — Discharge Instructions (Signed)
 Images of your cervical spine and lumbar spine did not show any fractures.  I suspect you have strained your neck and back.  You can take the muscle relaxer up to 2 times daily to help relax stiff muscles, this may cause drowsiness.  Take the anti-inflammatories twice daily with food as well.  Heat, ice and gentle stretching can help further loosen stiff muscles.  It is not uncommon to be in more pain over the next few days as your muscles fully stiffen.  If you have any severe pain or new concerning symptoms seek immediate care.  If you have lasting neck or back pain over the next few weeks please follow-up with sports medicine.
